# Patient Record
Sex: Female | Born: 1995 | Race: White | Hispanic: No | Marital: Married | Smoking: Never smoker
Health system: Southern US, Community
[De-identification: ages and names within clinical notes are randomized; demographics above are authoritative.]

## PROBLEM LIST (undated history)

## (undated) DIAGNOSIS — Z8619 Personal history of other infectious and parasitic diseases: Secondary | ICD-10-CM

## (undated) DIAGNOSIS — R011 Cardiac murmur, unspecified: Secondary | ICD-10-CM

## (undated) HISTORY — DX: Personal history of other infectious and parasitic diseases: Z86.19

## (undated) HISTORY — PX: WISDOM TOOTH EXTRACTION: SHX21

## (undated) HISTORY — DX: Cardiac murmur, unspecified: R01.1

---

## 2016-06-24 ENCOUNTER — Encounter: Payer: Self-pay | Admitting: Emergency Medicine

## 2016-06-24 ENCOUNTER — Emergency Department
Admission: EM | Admit: 2016-06-24 | Discharge: 2016-06-24 | Discharge status: Against Medical Advice | Payer: Self-pay | Attending: Emergency Medicine | Admitting: Emergency Medicine

## 2016-06-24 DIAGNOSIS — L739 Follicular disorder, unspecified: Secondary | ICD-10-CM | POA: Insufficient documentation

## 2016-06-24 DIAGNOSIS — L539 Erythematous condition, unspecified: Secondary | ICD-10-CM | POA: Insufficient documentation

## 2016-06-24 LAB — BASIC METABOLIC PANEL
ANION GAP: 5 (ref 5–15)
BUN: 15 mg/dL (ref 6–20)
CALCIUM: 9.6 mg/dL (ref 8.9–10.3)
CO2: 27 mmol/L (ref 22–32)
CREATININE: 0.98 mg/dL (ref 0.44–1.00)
Chloride: 106 mmol/L (ref 101–111)
GFR calc non Af Amer: >60 mL/min (ref >60)
Glucose, Bld: 109 mg/dL — ABNORMAL HIGH (ref 65–99)
Potassium: 3.1 mmol/L — ABNORMAL LOW (ref 3.5–5.1)
SODIUM: 138 mmol/L (ref 135–145)

## 2016-06-24 LAB — CBC WITH DIFFERENTIAL/PLATELET
BASOS ABS: 0 10*3/uL (ref 0–0.1)
BASOS PCT: 0 %
EOS ABS: 0.1 10*3/uL (ref 0–0.7)
Eosinophils Relative: 1 %
HEMATOCRIT: 43.6 % (ref 35.0–47.0)
HEMOGLOBIN: 15.4 g/dL (ref 12.0–16.0)
Lymphocytes Relative: 14 %
Lymphs Abs: 1.8 10*3/uL (ref 1.0–3.6)
MCH: 31.1 pg (ref 26.0–34.0)
MCHC: 35.2 g/dL (ref 32.0–36.0)
MCV: 88.3 fL (ref 80.0–100.0)
MONO ABS: 0.1 10*3/uL — AB (ref 0.2–0.9)
MONOS PCT: 1 %
NEUTROS ABS: 10.8 10*3/uL — AB (ref 1.4–6.5)
NEUTROS PCT: 84 %
Platelets: 246 10*3/uL (ref 150–440)
RBC: 4.94 MIL/uL (ref 3.80–5.20)
RDW: 12.6 % (ref 11.5–14.5)
WBC: 12.8 10*3/uL — ABNORMAL HIGH (ref 3.6–11.0)

## 2016-06-24 LAB — URINALYSIS COMPLETE WITH MICROSCOPIC (ARMC ONLY)
BILIRUBIN URINE: NEGATIVE
GLUCOSE, UA: NEGATIVE mg/dL
HGB URINE DIPSTICK: NEGATIVE
KETONES UR: NEGATIVE mg/dL
LEUKOCYTES UA: NEGATIVE
NITRITE: NEGATIVE
PH: 5 (ref 5.0–8.0)
Protein, ur: NEGATIVE mg/dL
SPECIFIC GRAVITY, URINE: 1.01 (ref 1.005–1.030)

## 2016-06-24 LAB — POCT PREGNANCY, URINE: Preg Test, Ur: NEGATIVE

## 2016-06-24 MED ORDER — DIPHENHYDRAMINE HCL 12.5 MG/5ML PO ELIX
25.0000 mg | ORAL_SOLUTION | Freq: Once | ORAL | Status: DC
  Filled 2016-06-24: qty 10

## 2016-06-24 MED ORDER — FAMOTIDINE IN NACL 20-0.9 MG/50ML-% IV SOLN
20.0000 mg | Freq: Once | INTRAVENOUS | Status: AC
  Administered 2016-06-24: 20 mg via INTRAVENOUS
  Filled 2016-06-24: qty 50

## 2016-06-24 MED ORDER — EPINEPHRINE 0.3 MG/0.3ML IJ SOAJ: 0.3000 mg | Freq: Once | INTRAMUSCULAR | 0 refills | Status: AC

## 2016-06-24 MED ORDER — MUPIROCIN CALCIUM 2 % EX CREA: TOPICAL_CREAM | CUTANEOUS | 0 refills | Status: DC

## 2016-06-24 MED ORDER — ACETAMINOPHEN 325 MG PO TABS
650.0000 mg | ORAL_TABLET | Freq: Once | ORAL | Status: AC
Start: 2016-06-24 — End: 2016-06-24
  Administered 2016-06-24: 650 mg via ORAL
  Filled 2016-06-24: qty 2

## 2016-06-24 MED ORDER — METHYLPREDNISOLONE SODIUM SUCC 125 MG IJ SOLR
125.0000 mg | Freq: Once | INTRAMUSCULAR | Status: AC
  Administered 2016-06-24: 125 mg via INTRAVENOUS
  Filled 2016-06-24: qty 2

## 2016-06-24 MED ORDER — PREDNISONE 10 MG PO TABS: ORAL_TABLET | ORAL | 0 refills | Status: DC

## 2016-06-24 MED ORDER — SODIUM CHLORIDE 0.9 % IV BOLUS (SEPSIS)
1000.0000 mL | Freq: Once | INTRAVENOUS | Status: AC
  Administered 2016-06-24: 1000 mL via INTRAVENOUS

## 2016-06-24 MED ORDER — DIPHENHYDRAMINE HCL 50 MG/ML IJ SOLN
25.0000 mg | Freq: Once | INTRAMUSCULAR | Status: AC
  Administered 2016-06-24: 25 mg via INTRAVENOUS

## 2016-06-24 NOTE — ED Triage Notes (Addendum)
Patient ambulatory to triage with steady gait, without difficulty or distress noted; pt reports onset generalized itching after taking sulfa at 8pm (staph infection on leg); pt completed antibiotics in Aug for same; symptoms reoccured week ago; took 2 benadryl at 9pm; pt with several small areas to right leg that appear to be abrasions; st contact with nephew who had ?impetigo; pt st now with chills, skin flushed, c/o pain in hips

## 2016-06-24 NOTE — Discharge Instructions (Signed)
I am concerned about your symptoms today. He had a very significant reaction to that medication. You  must never take anything with sulfa and it again including Bactrim. I have advised you be admitted to the hospital but he would prefer to go home. This is your  choice but does very much limit our ability to watch you as your symptoms possibly progress. Of course, you're welcome and encouraged and advised to return if you feel worse in any way. This means, if you have any swelling to her throat or tongue or lips, any sores or lesions in your mouth, eyes, vagina or any of your other surfaces that we discussed. If you have pain when he urinated, if you have persistent high fevers. If he noticed any of your skin starting to flake or come off. Or if you feel worse in any way. It is vital that you be very vigilant about these symptoms and come back if you need us. We'll send you with a prescription for an EpiPen to use only if you have a life-threatening emergency, that is to say if you feel like he cannot breathe we are otherwise overcome by this reaction. Do not use it unless you must but if you feel that there is a life-threatening reaction use it and call 911. We are giving a prescription for steroids to take. It is a 2 week course of steroids to try to keep this under control. Take it until it is gone. We advised you take 60 mg a day every day for a week and then go down by 10 mg every day for the next week until it is gone. We strongly advise you follow up as an outpatient. For the small areas on your leg I will give you a prescription for mupirocin, apply topically and if those areas get worse return to the emergency room. Please be very vigilant about your health and if you feel worse in any way do not hesitate to come back. As we discussed, sometimes the reaction that you had can progress somewhat rapidly to very significant medical problems involving especially your skin but also your mucosal surfaces and your  body in general.

## 2016-06-24 NOTE — ED Notes (Signed)
Pt. States she started sulfa antibiotic tonight at around 8 pm for a reeaccuate staff infection she had last month.  Pt. States itching started soon after.  Pt. States she took benadryl at about 9 pm.  Pt. Has small lesion on lower rt. Leg and 3 lesions on upper rt. Leg.  Pt. Unsure if this sulfa antibiotic was the same she took last month.

## 2016-06-24 NOTE — ED Notes (Signed)
Pt. States pain in hips has gone away.

## 2016-06-24 NOTE — ED Notes (Signed)
Pt. Up to use bathroom and give urine specimen.

## 2016-06-24 NOTE — ED Provider Notes (Addendum)
[facility] Emergency Department Provider Note  ____________________________________________   I have reviewed the triage vital signs and the nursing notes.   HISTORY  Chief Complaint Allergic Reaction and Fever    HPI Susan Cannon is a 20 y.o. female  who is healthy presents today complaining of a reaction after taking Bactrim. Patient has small impetigo-like lesions on her legs. She states she was recently treated, several weeks ago, with an antibiotic and she cannot recall the name of it. She was given Bactrim today, and took 1 dose. Immediately thereafter, 10 minutes, she began to have diffuse myalgias, red skin, joint pain, injected eyes and felt very poorly. She tried Benadryl and felt a little bit better, that was around 8 PM. However, symptoms have returned and she returns the emergency room. Initially she did have some sensation of tongue swelling that is gone,  upon arrival, she complains of diffuse myalgias and erythematous skin no oral lesions. Denies any shortness of breath or difficulty swallowing or talking      No past medical history on file.  There are no active problems to display for this patient.   History reviewed. No pertinent surgical history.  Prior to Admission medications   Not on File    Allergies Review of patient's allergies indicates no known allergies.  No family history on file.  Social History Social History  Substance Use Topics  . Smoking status: Never Smoker  . Smokeless tobacco: Never Used  . Alcohol use No    Review of Systems Constitutional: She had no fever or chills prior to taking the medication Eyes: No visual changes. ENT: No sore throat. No stiff neck no neck pain Cardiovascular: Denies chest pain. Respiratory: Denies shortness of breath. Gastrointestinal:   no vomiting.  No diarrhea.  No constipation. Genitourinary: Negative for dysuria. Musculoskeletal: Negative lower extremity swelling Skin:  See history of present illness Neurological: Negative for severe headaches, focal weakness or numbness. 10-point ROS otherwise negative.  ____________________________________________   PHYSICAL EXAM:  VITAL SIGNS: ED Triage Vitals  Enc Vitals Group     BP 06/24/16 0040 116/81     Pulse Rate 06/24/16 0040 (!) 130     Resp 06/24/16 0040 18     Temp 06/24/16 0040 99.7 F (37.6 C)     Temp Source 06/24/16 0040 Oral     SpO2 06/24/16 0040 100 %     Weight 06/24/16 0038 130 lb (59 kg)     Height 06/24/16 0038 5\' 4"  (1.626 m)     Head Circumference --      Peak Flow --      Pain Score 06/24/16 0038 10     Pain Loc --      Pain Edu? --      Excl. in GC? --     Constitutional: Alert and oriented. Patient with mild anxiety. Eyes: Conjunctivae are injected. PERRL. EOMI. Head: Atraumatic. Nose: No congestion/rhinnorhea. Mouth/Throat: Mucous membranes are moist.  Oropharynx non-erythematous. Neck: No stridor.   Nontender with no meningismus Cardiovascular: Tachycardia, mild, regular rhythm. Grossly normal heart sounds.  Good peripheral circulation. Respiratory: Normal respiratory effort.  No retractions. Lungs CTAB. Abdominal: Soft and nontender. No distention. No guarding no rebound Back:  There is no focal tenderness or step off.  there is no midline tenderness there are no lesions noted. there is no CVA tenderness Musculoskeletal: No lower extremity tenderness, no upper extremity tenderness. No joint effusions, no DVT signs strong distal pulses no edema Neurologic:  Normal speech and language. No gross focal neurologic deficits are appreciated.  Skin:  Skin is warm, dry and intact. Diffuse blanchable erythema noted. There is one small area about 0.75 cm and round on the lower leg and a few others similarly on the upper leg. They're blanchable, erythematous, they're not raised. They're in various stages of hearing. They're not vesicular. One of them seems to be scabbed over. They are  small little macules. Psychiatric: Mood and affect are normal. Speech and behavior are normal.  ____________________________________________   LABS (all labs ordered are listed, but only abnormal results are displayed)  Labs Reviewed  CBC WITH DIFFERENTIAL/PLATELET - Abnormal; Notable for the following:       Result Value   WBC 12.8 (*)    Neutro Abs 10.8 (*)    Monocytes Absolute 0.1 (*)    All other components within normal limits  BASIC METABOLIC PANEL - Abnormal; Notable for the following:    Potassium 3.1 (*)    Glucose, Bld 109 (*)    All other components within normal limits  URINALYSIS COMPLETEWITH MICROSCOPIC ([facility] ONLY) - Abnormal; Notable for the following:    Color, Urine STRAW (*)    APPearance CLEAR (*)    Bacteria, UA RARE (*)    Squamous Epithelial / LPF 0-5 (*)    All other components within normal limits  POCT PREGNANCY, URINE  POC URINE PREG, ED   ____________________________________________  EKG  I personally interpreted any EKGs ordered by me or triage  ____________________________________________  RADIOLOGY  I reviewed any imaging ordered by me or triage that were performed during my shift and, if possible, patient and/or family made aware of any abnormal findings. ____________________________________________   PROCEDURES  Procedure(s) performed: None  Procedures  Critical Care performed: None  ____________________________________________   INITIAL IMPRESSION / ASSESSMENT AND PLAN / ED COURSE  Pertinent labs & imaging results that were available during my care of the patient were reviewed by me and considered in my medical decision making (see chart for details).  Patient presents with acute onset fever and myalgias erythema and initially some subjective throat tightness. The tightness is completely gone. Patient was given Benadryl Pepcid and steroids here. She had some mild improvement. She does however remain with if use erythematous  scan. The fever also began immediately after the medication. All this is quite consistent with erythroderma or other acute drug reaction. There is no evidence of Stevens-Johnson or desquamation at this time. Patient I think would benefit from admission to the hospital for observation given this acute and serious drug reaction. No active airway issues at this time.  ----------------------------------------- 5:06 AM on 06/24/2016 -----------------------------------------  When the hospitalist evaluated the patient she really declined admission. We have explained him the risks benefits and alternatives to going home including the risk of worsening condition and she states that she feels better and would like to go home. She lives very close to the hospital and states she'll come back if she feels worse. Obviously this is her choice she is certainly capable of understanding the risks benefits and alternatives as explained by myself and the hospitalist. I will continue to observe her here and if she continues to improve we will discharge her at her request. She understands and my recommendation is admission however.  ----------------------------------------- 6:40 AM on 06/24/2016 -----------------------------------------  Patient states she feels better. She is afebrile, heart rate is coming down, she has still mild erythema on her skin, no evidence of oral lesions  no evidence of anaphylactic reaction no evidence of trouble breathing or angioedema. Nonetheless, given the severe drug reaction, I am very reluctant to send her home and I have explained to her the risk of Stevens-Johnson which can be fatal or disfiguring with other morbidities and she understands however she and her boyfriend are much would prefer to go home and they did refuse admission. I feel that she is the capacity to understand the decision-making process here. We will start her on a steroid taper for this and I have given her extensive  return precautions for any signs of Stevens-Johnson including oral vaginal ocular or skin lesions including desquamation. I have also given her information about any other progression of symptoms from this area I remain somewhat concerned about her and I feel that he'll be better for her to be admitted but she refuses and she feels better at this time we will discharge her. I have indicated that she should have a very low threshold for returning. We will use mupirocin for her small bowel localized reactions. I see areas where she has had these before and has left some blanching of the skin. I'm not sure what exact pathology is present. Not classical for infection but I think it topical ointment may be of some use. I have strongly advised that she follow-up with dermatology and give her information about follow-up as well as follow-up with [place] if needed or she changes her mind and follow-up with the total clinic as well. Even though she is elected to leave AGAINST MEDICAL ADVICE, I will certainly of course provide her with the appropriate prescriptions including a steroid taper and EpiPen. We have explained to her exactly when she should use the EpiPen  Clinical Course   ____________________________________________   FINAL CLINICAL IMPRESSION(S) / ED DIAGNOSES  Final diagnoses:  None      This chart was dictated using voice recognition software.  Despite best efforts to proofread,  errors can occur which can change meaning.      Jeanmarie Plant, MD 06/24/16 1308    Jeanmarie Plant, MD 06/24/16 0507    Jeanmarie Plant, MD 06/24/16 [id number]    Jeanmarie Plant, MD 06/24/16 [phone]

## 2017-02-28 ENCOUNTER — Encounter: Payer: Self-pay | Admitting: Internal Medicine

## 2017-02-28 ENCOUNTER — Ambulatory Visit (INDEPENDENT_AMBULATORY_CARE_PROVIDER_SITE_OTHER): Payer: Self-pay | Admitting: Internal Medicine

## 2017-02-28 VITALS — BP 108/68 | HR 78 | Temp 98.1°F | Ht 64.75 in | Wt 129.0 lb

## 2017-02-28 DIAGNOSIS — Z30011 Encounter for initial prescription of contraceptive pills: Secondary | ICD-10-CM

## 2017-02-28 MED ORDER — NORETHINDRONE ACET-ETHINYL EST 1-20 MG-MCG PO TABS: 1.0000 | ORAL_TABLET | Freq: Every day | ORAL | 11 refills | Status: DC

## 2017-02-28 NOTE — Progress Notes (Signed)
HPI  Pt presents to the clinic today to establish care. She has not had a PCP in a few years.  She wants to discuss contraception today. Her periods are regular. Her LMP was 02/24/17. She is not sexually active at this time, but is getting married in May.  Flu: never Tetanus: ? 2010 Dentist: annually  Past Medical History:  Diagnosis Date  . Heart murmur   . History of chicken pox    21 y.o.    No current outpatient prescriptions on file.   No current facility-administered medications for this visit.     Allergies  Allergen Reactions  . Sulfa Antibiotics Anaphylaxis    Family History  Problem Relation Age of Onset  . Colon cancer Paternal Grandfather     Social History   Social History  . Marital status: Single    Spouse name: N/A  . Number of children: N/A  . Years of education: N/A   Occupational History  . Not on file.   Social History Main Topics  . Smoking status: Never Smoker  . Smokeless tobacco: Never Used  . Alcohol use No  . Drug use: No  . Sexual activity: Not on file   Other Topics Concern  . Not on file   Social History Narrative  . No narrative on file    ROS:  Constitutional: Denies fever, malaise, fatigue, headache or abrupt weight changes.  HEENT: Denies eye pain, eye redness, ear pain, ringing in the ears, wax buildup, runny nose, nasal congestion, bloody nose, or sore throat. Respiratory: Denies difficulty breathing, shortness of breath, cough or sputum production.   Cardiovascular: Denies chest pain, chest tightness, palpitations or swelling in the hands or feet.  Gastrointestinal: Denies abdominal pain, bloating, constipation, diarrhea or blood in the stool.  GU: Denies frequency, urgency, pain with urination, blood in urine, odor or discharge. Musculoskeletal: Denies decrease in range of motion, difficulty with gait, muscle pain or joint pain and swelling.  Skin: Denies redness, rashes, lesions or ulcercations.  Neurological:  Denies dizziness, difficulty with memory, difficulty with speech or problems with balance and coordination.  Psych: Denies anxiety, depression, SI/HI.  No other specific complaints in a complete review of systems (except as listed in HPI above).  PE:  BP 108/68   Pulse 78   Temp 98.1 F (36.7 C) (Oral)   Ht 5' 4.75" (1.645 m)   Wt 129 lb (58.5 kg)   LMP 02/24/2017   SpO2 98%   BMI 21.63 kg/m   Wt Readings from Last 3 Encounters:  02/28/17 129 lb (58.5 kg)  06/24/16 130 lb (59 kg)    General: Appears her stated age,  in NAD. Skin: Dry and intact. Cardiovascular: Normal rate and rhythm.  Pulmonary/Chest: Normal effort and positive vesicular breath sounds. No respiratory distress. No wheezes, rales or ronchi noted.  Neurological: Alert and oriented.  Psychiatric: Mood and affect normal. Behavior is normal. Judgment and thought content normal.     BMET    Component Value Date/Time   NA 138 06/24/2016 0052   K 3.1 (L) 06/24/2016 0052   CL 106 06/24/2016 0052   CO2 27 06/24/2016 0052   GLUCOSE 109 (H) 06/24/2016 0052   BUN 15 06/24/2016 0052   CREATININE 0.98 06/24/2016 0052   CALCIUM 9.6 06/24/2016 0052   GFRNONAA >60 06/24/2016 0052   GFRAA >60 06/24/2016 0052    Lipid Panel  No results found for: CHOL, TRIG, HDL, CHOLHDL, VLDL, LDLCALC  CBC  Component Value Date/Time   WBC 12.8 (H) 06/24/2016 0052   RBC 4.94 06/24/2016 0052   HGB 15.4 06/24/2016 0052   HCT 43.6 06/24/2016 0052   PLT 246 06/24/2016 0052   MCV 88.3 06/24/2016 0052   MCH 31.1 06/24/2016 0052   MCHC 35.2 06/24/2016 0052   RDW 12.6 06/24/2016 0052   LYMPHSABS 1.8 06/24/2016 0052   MONOABS 0.1 (L) 06/24/2016 0052   EOSABS 0.1 06/24/2016 0052   BASOSABS 0.0 06/24/2016 0052    Hgb A1C No results found for: HGBA1C   Assessment and Plan:  Encounter for Initiation of OCPs:  Discussed risks and benefits of the pill Discussed using a backup method the first 2 weeks and when on abx eRx  for Junel sent to pharmacy Discussed cervical cancer screening starting at age 821  Make an appt for your annual exam Nicki ReaperBAITY, REGINA, NP

## 2017-02-28 NOTE — Patient Instructions (Signed)

## 2018-01-18 ENCOUNTER — Telehealth: Payer: Self-pay

## 2018-01-18 MED ORDER — NORETHINDRONE ACET-ETHINYL EST 1-20 MG-MCG PO TABS: 1.0000 | ORAL_TABLET | Freq: Every day | ORAL | 1 refills | Status: DC

## 2018-01-18 NOTE — Telephone Encounter (Signed)
Copied from CRM [id number]. Topic: Appointment Scheduling - Scheduling Inquiry for Clinic >> Jan 18, 2018  8:34 AM Eston Mouldavis, Cheri B wrote: Reason for CRM: PT has cpe scheduled in May but has no more refills on birth control,  she wants to know if she needs to make an apt before the cpe apt to be able to continue getting birth control

## 2018-01-18 NOTE — Telephone Encounter (Signed)
I spoke with [facility] and the rx was transferred to their pharmacy and pt has no more refills. Verbally gave OK for 2 refills until CPX 02/21/18. Pt notified and voiced understanding.

## 2018-02-14 ENCOUNTER — Other Ambulatory Visit: Payer: Self-pay

## 2018-02-21 ENCOUNTER — Other Ambulatory Visit (HOSPITAL_COMMUNITY)
Admission: RE | Admit: 2018-02-21 | Discharge: 2018-02-21 | Disposition: A | Discharge status: Home / Self Care | Payer: Managed Care, Other (non HMO) | Source: Ambulatory Visit | Attending: Internal Medicine | Admitting: Internal Medicine

## 2018-02-21 ENCOUNTER — Ambulatory Visit (INDEPENDENT_AMBULATORY_CARE_PROVIDER_SITE_OTHER): Payer: Managed Care, Other (non HMO) | Admitting: Internal Medicine

## 2018-02-21 ENCOUNTER — Encounter: Payer: Self-pay | Admitting: Internal Medicine

## 2018-02-21 VITALS — BP 106/70 | HR 73 | Temp 98.4°F | Ht 64.5 in | Wt 138.0 lb

## 2018-02-21 DIAGNOSIS — Z23 Encounter for immunization: Secondary | ICD-10-CM

## 2018-02-21 DIAGNOSIS — Z0000 Encounter for general adult medical examination without abnormal findings: Secondary | ICD-10-CM

## 2018-02-21 DIAGNOSIS — Z113 Encounter for screening for infections with a predominantly sexual mode of transmission: Secondary | ICD-10-CM

## 2018-02-21 DIAGNOSIS — N926 Irregular menstruation, unspecified: Secondary | ICD-10-CM | POA: Insufficient documentation

## 2018-02-21 DIAGNOSIS — Z124 Encounter for screening for malignant neoplasm of cervix: Secondary | ICD-10-CM

## 2018-02-21 LAB — CBC
HEMATOCRIT: 40.9 % (ref 36.0–46.0)
HEMOGLOBIN: 13.9 g/dL (ref 12.0–15.0)
MCHC: 34 g/dL (ref 30.0–36.0)
MCV: 91.9 fl (ref 78.0–100.0)
PLATELETS: 312 10*3/uL (ref 150.0–400.0)
RBC: 4.45 Mil/uL (ref 3.87–5.11)
RDW: 12.5 % (ref 11.5–15.5)
WBC: 11.2 10*3/uL — AB (ref 4.0–10.5)

## 2018-02-21 LAB — LIPID PANEL
CHOLESTEROL: 153 mg/dL (ref 0–200)
HDL: 47.3 mg/dL (ref >39.00)
LDL Cholesterol: 78 mg/dL (ref 0–99)
NonHDL: 105.33
Total CHOL/HDL Ratio: 3
Triglycerides: 136 mg/dL (ref 0.0–149.0)
VLDL: 27.2 mg/dL (ref 0.0–40.0)

## 2018-02-21 LAB — COMPREHENSIVE METABOLIC PANEL
ALBUMIN: 4.2 g/dL (ref 3.5–5.2)
ALK PHOS: 42 U/L (ref 39–117)
ALT: 13 U/L (ref 0–35)
AST: 16 U/L (ref 0–37)
BUN: 10 mg/dL (ref 6–23)
CO2: 28 mEq/L (ref 19–32)
Calcium: 9.6 mg/dL (ref 8.4–10.5)
Chloride: 102 mEq/L (ref 96–112)
Creatinine, Ser: 0.78 mg/dL (ref 0.40–1.20)
GFR: 98.43 mL/min (ref >60.00)
Glucose, Bld: 87 mg/dL (ref 70–99)
POTASSIUM: 4.1 meq/L (ref 3.5–5.1)
Sodium: 137 mEq/L (ref 135–145)
TOTAL PROTEIN: 7.1 g/dL (ref 6.0–8.3)
Total Bilirubin: 0.5 mg/dL (ref 0.2–1.2)

## 2018-02-21 LAB — POCT URINE PREGNANCY: Preg Test, Ur: NEGATIVE

## 2018-02-21 NOTE — Progress Notes (Signed)
Subjective:    Patient ID: Susan Cannon, female    DOB: [birth year], 22 y.o.   MRN: [id number]  HPI  Pt presents to the clinic today for her annual exam.  Flu: never Tetanus: ? 2010 Pap Smear: never Dentist: annually  Diet: She does eat meat. She consumes fruits and veggies daily. She does eat some fried foods. She drinks mostly water. Exercise: [employer] 6 days per week  Review of Systems      Past Medical History:  Diagnosis Date  . Heart murmur   . History of chicken pox    22 y.o.    Current Outpatient Medications  Medication Sig Dispense Refill  . norethindrone-ethinyl estradiol (MICROGESTIN,JUNEL,LOESTRIN) 1-20 MG-MCG tablet Take 1 tablet by mouth daily. 1 Package 1   No current facility-administered medications for this visit.     Allergies  Allergen Reactions  . Sulfa Antibiotics Anaphylaxis    Family History  Problem Relation Age of Onset  . Colon cancer Paternal Grandfather     Social History   Socioeconomic History  . Marital status: Single    Spouse name: Not on file  . Number of children: Not on file  . Years of education: Not on file  . Highest education level: Not on file  Occupational History  . Not on file  Social Needs  . Financial resource strain: Not on file  . Food insecurity:    Worry: Not on file    Inability: Not on file  . Transportation needs:    Medical: Not on file    Non-medical: Not on file  Tobacco Use  . Smoking status: Never Smoker  . Smokeless tobacco: Never Used  Substance and Sexual Activity  . Alcohol use: No  . Drug use: No  . Sexual activity: Yes  Lifestyle  . Physical activity:    Days per week: Not on file    Minutes per session: Not on file  . Stress: Not on file  Relationships  . Social connections:    Talks on phone: Not on file    Gets together: Not on file    Attends religious service: Not on file    Active member of club or organization: Not on file    Attends meetings of clubs or  organizations: Not on file    Relationship status: Not on file  . Intimate partner violence:    Fear of current or ex partner: Not on file    Emotionally abused: Not on file    Physically abused: Not on file    Forced sexual activity: Not on file  Other Topics Concern  . Not on file  Social History Narrative  . Not on file     Constitutional: Denies fever, malaise, fatigue, headache or abrupt weight changes.  HEENT: Denies eye pain, eye redness, ear pain, ringing in the ears, wax buildup, runny nose, nasal congestion, bloody nose, or sore throat. Respiratory: Denies difficulty breathing, shortness of breath, cough or sputum production.   Cardiovascular: Denies chest pain, chest tightness, palpitations or swelling in the hands or feet.  Gastrointestinal: Denies abdominal pain, bloating, constipation, diarrhea or blood in the stool.  GU: Pt reports irregular periods. Denies urgency, frequency, pain with urination, burning sensation, blood in urine, odor or discharge. Musculoskeletal: Denies decrease in range of motion, difficulty with gait, muscle pain or joint pain and swelling.  Skin: Denies redness, rashes, lesions or ulcercations.  Neurological: Denies dizziness, difficulty with memory, difficulty with speech or problems with balance  and coordination.  Psych: Denies anxiety, depression, SI/HI.  No other specific complaints in a complete review of systems (except as listed in HPI above).  Objective:   Physical Exam   BP 106/70   Pulse 73   Temp 98.4 F (36.9 C) (Oral)   Ht 5' 4.5" (1.638 m)   Wt 138 lb (62.6 kg)   LMP 11/26/2017 Comment: irregular  SpO2 98%   BMI 23.32 kg/m  Wt Readings from Last 3 Encounters:  02/21/18 138 lb (62.6 kg)  02/28/17 129 lb (58.5 kg)  06/24/16 130 lb (59 kg)    General: Appears her stated age, well developed, well nourished in NAD. Skin: Warm, dry and intact. No rashes, lesions or ulcerations noted. HEENT: Head: normal shape and size;  Eyes: sclera white, no icterus, conjunctiva pink, PERRLA and EOMs intact; Ears: Tm's gray and intact, normal light reflex; Throat/Mouth: Teeth present, mucosa pink and moist, no exudate, lesions or ulcerations noted.  Neck:  Neck supple, trachea midline. No masses, lumps or thyromegaly present.  Cardiovascular: Normal rate and rhythm. S1,S2 noted.  No murmur, rubs or gallops noted. No JVD or BLE edema.  Pulmonary/Chest: Normal effort and positive vesicular breath sounds. No respiratory distress. No wheezes, rales or ronchi noted.  Abdomen: Soft and nontender. Normal bowel sounds. No distention or masses noted. Liver, spleen and kidneys non palpable. Pelvic: Normal female anatomy. Cervix without changes. Small amount of thick, white discharge noted in vaginal vault. No CMT. Adnexa non palpable. Musculoskeletal: Strength 5/5 BUE/BLE. No difficulty with gait.  Neurological: Alert and oriented. Cranial nerves II-XII grossly intact. Coordination normal.  Psychiatric: Mood and affect normal. Behavior is normal. Judgment and thought content normal.     BMET    Component Value Date/Time   NA 138 06/24/2016 0052   K 3.1 (L) 06/24/2016 0052   CL 106 06/24/2016 0052   CO2 27 06/24/2016 0052   GLUCOSE 109 (H) 06/24/2016 0052   BUN 15 06/24/2016 0052   CREATININE 0.98 06/24/2016 0052   CALCIUM 9.6 06/24/2016 0052   GFRNONAA >60 06/24/2016 0052   GFRAA >60 06/24/2016 0052    Lipid Panel  No results found for: CHOL, TRIG, HDL, CHOLHDL, VLDL, LDLCALC  CBC    Component Value Date/Time   WBC 12.8 (H) 06/24/2016 0052   RBC 4.94 06/24/2016 0052   HGB 15.4 06/24/2016 0052   HCT 43.6 06/24/2016 0052   PLT 246 06/24/2016 0052   MCV 88.3 06/24/2016 0052   MCH 31.1 06/24/2016 0052   MCHC 35.2 06/24/2016 0052   RDW 12.6 06/24/2016 0052   LYMPHSABS 1.8 06/24/2016 0052   MONOABS 0.1 (L) 06/24/2016 0052   EOSABS 0.1 06/24/2016 0052   BASOSABS 0.0 06/24/2016 0052    Hgb A1C No results found for:  HGBA1C         Assessment & Plan:   Preventative Health Maintenance:  Encouraged her to get a flu shot today Tdap today Pap smear today including STD screening, she declines HIV or RPR Encouraged her to consume a balanced diet and exercise regimen Advised her to see an eye doctor and dentist annually Will check CBC, CMET, Lipid profile today  Irregular Periods:  Urine HcG today negative Continue OCP's  RTC in 1 year, sooner if needed Nicki Reaper, NP

## 2018-02-21 NOTE — Addendum Note (Signed)
Addended by: Roena Malady on: 02/21/2018 04:05 PM   Modules accepted: Orders

## 2018-02-21 NOTE — Patient Instructions (Signed)

## 2018-02-22 NOTE — Addendum Note (Signed)
Addended by: Roena Malady on: 02/22/2018 09:29 AM   Modules accepted: Orders

## 2018-02-24 ENCOUNTER — Other Ambulatory Visit: Payer: Self-pay | Admitting: Internal Medicine

## 2018-02-24 LAB — CYTOLOGY - PAP
Bacterial vaginitis: NEGATIVE
Candida vaginitis: POSITIVE — AB
Chlamydia: NEGATIVE
DIAGNOSIS: NEGATIVE
NEISSERIA GONORRHEA: NEGATIVE
TRICH (WINDOWPATH): NEGATIVE

## 2018-02-24 MED ORDER — FLUCONAZOLE 150 MG PO TABS
150.0000 mg | ORAL_TABLET | Freq: Once | ORAL | 0 refills | Status: AC
Start: 2018-02-24 — End: 2018-02-24

## 2018-02-24 NOTE — Progress Notes (Signed)
Diflucan

## 2018-03-18 ENCOUNTER — Other Ambulatory Visit: Payer: Self-pay | Admitting: Internal Medicine

## 2018-03-20 ENCOUNTER — Other Ambulatory Visit: Payer: Self-pay | Admitting: Internal Medicine

## 2018-11-30 DIAGNOSIS — F411 Generalized anxiety disorder: Secondary | ICD-10-CM | POA: Insufficient documentation

## 2019-08-13 ENCOUNTER — Other Ambulatory Visit: Payer: Self-pay

## 2019-08-13 DIAGNOSIS — Z20822 Contact with and (suspected) exposure to covid-19: Secondary | ICD-10-CM

## 2019-08-14 LAB — NOVEL CORONAVIRUS, NAA: SARS-CoV-2, NAA: NOT DETECTED

## 2020-02-20 DIAGNOSIS — Z1389 Encounter for screening for other disorder: Secondary | ICD-10-CM | POA: Diagnosis not present

## 2020-02-20 DIAGNOSIS — Z0000 Encounter for general adult medical examination without abnormal findings: Secondary | ICD-10-CM | POA: Diagnosis not present

## 2020-02-20 DIAGNOSIS — Z13220 Encounter for screening for lipoid disorders: Secondary | ICD-10-CM | POA: Diagnosis not present

## 2020-02-20 DIAGNOSIS — Z1159 Encounter for screening for other viral diseases: Secondary | ICD-10-CM | POA: Diagnosis not present

## 2020-02-22 DIAGNOSIS — R809 Proteinuria, unspecified: Secondary | ICD-10-CM | POA: Diagnosis not present

## 2020-02-22 DIAGNOSIS — Z23 Encounter for immunization: Secondary | ICD-10-CM | POA: Diagnosis not present

## 2020-03-12 DIAGNOSIS — N912 Amenorrhea, unspecified: Secondary | ICD-10-CM | POA: Diagnosis not present

## 2020-04-09 DIAGNOSIS — Z1329 Encounter for screening for other suspected endocrine disorder: Secondary | ICD-10-CM | POA: Diagnosis not present

## 2020-04-09 DIAGNOSIS — Z114 Encounter for screening for human immunodeficiency virus [HIV]: Secondary | ICD-10-CM | POA: Diagnosis not present

## 2020-04-09 DIAGNOSIS — Z131 Encounter for screening for diabetes mellitus: Secondary | ICD-10-CM | POA: Diagnosis not present

## 2020-04-09 DIAGNOSIS — Z3491 Encounter for supervision of normal pregnancy, unspecified, first trimester: Secondary | ICD-10-CM | POA: Diagnosis not present

## 2020-04-09 DIAGNOSIS — Z113 Encounter for screening for infections with a predominantly sexual mode of transmission: Secondary | ICD-10-CM | POA: Diagnosis not present

## 2020-04-17 DIAGNOSIS — O3680X0 Pregnancy with inconclusive fetal viability, not applicable or unspecified: Secondary | ICD-10-CM | POA: Diagnosis not present

## 2020-04-17 DIAGNOSIS — O021 Missed abortion: Secondary | ICD-10-CM | POA: Diagnosis not present

## 2020-04-17 NOTE — H&P (Signed)
Chief Complaint:      Chief Complaint  Patient presents with  . Miscarriage    HPI:  Susan Cannon is a 24 y.o. G32P0000 female who presents for an established patient office visit for follow up after ultrasound to assess fetal viability. Heart tones were unable to be heard with doppler at appointment earlier today and tablet ultrasound was concerning for non-viable preganncy.   Patient's last menstrual period was 01/22/2020.      Patient Active Problem List  Diagnosis  . GAD (generalized anxiety disorder)  . Procreation management investigation and testing  . Amenorrhea  . Supervision of low-risk pregnancy, first trimester  . Pregnancy with uncertain fetal viability, single or unspecified fetus     Past Medical History:  has a past medical history of Anxiety and Heart murmur, unspecified.  Past Surgical History:  has a past surgical history that includes other (2017). Family History: family history includes High blood pressure (Hypertension) in her father; Hyperlipidemia (Elevated cholesterol) in her father; No Known Problems in her mother. Social History:  reports that she has never smoked. She has never used smokeless tobacco. She reports that she does not drink alcohol and does not use drugs. OB/GYN History:          OB History    Gravida  1   Para  0   Term  0   Preterm  0   AB  0   Living  0     SAB  0   TAB  0   Ectopic  0   Molar  0   Multiple  0   Live Births  0          Allergies: is allergic to sulfa (sulfonamide antibiotics). Medications:  Current Outpatient Medications:  .  prenatal vitamin-iron-FA (PRENATE PLUS) tablet, Take 1 tablet by mouth once daily, Disp: , Rfl:   Review of Systems: Pertinent positives and negatives per HPI, otherwise a 3 system ROS was negative.   Objective:    Exam:   Constitutional:    Vitals:   04/17/20 1511  BP: 120/76   Body mass index is 25.29 kg/m.   General Appearance:     Well-developed, well-nourished, tearful, appears stated age  Back:     Symmetric, no curvature, no CVA tenderness  Lungs:     Respirations unlabored  Neuro: Psych:   Alert, oriented x3  Appropriate mood and insight, judgement intact   TVUS 03/12/2020: -SIUP Seen with yolk sac and fetal pole -CRL=0.88cm=[redacted]w[redacted]d -FHR=145bpm -Cervical length=4.36cm  TVUS today: -SIUP seen with non-viable fetal pole -CRL=1.75cm=[redacted]w[redacted]d, not CWD -No fetal heart motion seen -b/l ovaries WNL   Labs: Blood type: O+   Impression:   The encounter diagnosis was Missed abortion.   Plan:   Spontaneous miscarriage:  Causes of spontaneous miscarriage discussed with patient, including prevalence, common causes, and the expectation that this event does not increase the chance that she will miscarry again in the future. Emotional support given.  Management options discussed, including expectant, medical and surgical. Susan Cannon has opted for surgical managment.   Surgical management:  Consents signed today. Risks of surgery were discussed with the patient including but not limited to: bleeding which may require transfusion; infection which may require antibiotics; injury to uterus or surrounding organs; intrauterine scarring which may impair future fertility; need for additional procedures including laparotomy or laparoscopy; and other postoperative/anesthesia complications. Written informed consent was obtained.  This is a scheduled for 4 days from now. She will  have a postop visit in 2 weeks to review operative findings and pathology.  She may want Anora- ask in preop. Husband also will want to be in preop with her

## 2020-04-18 ENCOUNTER — Other Ambulatory Visit: Payer: Self-pay | Admitting: Obstetrics and Gynecology

## 2020-04-18 ENCOUNTER — Other Ambulatory Visit
Admission: RE | Admit: 2020-04-18 | Discharge: 2020-04-18 | Disposition: A | Discharge status: Home / Self Care | Payer: 59 | Source: Ambulatory Visit | Attending: Obstetrics and Gynecology | Admitting: Obstetrics and Gynecology

## 2020-04-18 ENCOUNTER — Other Ambulatory Visit: Payer: Self-pay

## 2020-04-18 DIAGNOSIS — Z20822 Contact with and (suspected) exposure to covid-19: Secondary | ICD-10-CM | POA: Diagnosis not present

## 2020-04-18 DIAGNOSIS — Z01812 Encounter for preprocedural laboratory examination: Secondary | ICD-10-CM | POA: Insufficient documentation

## 2020-04-19 LAB — SARS CORONAVIRUS 2 (TAT 6-24 HRS): SARS Coronavirus 2: NEGATIVE

## 2020-04-21 ENCOUNTER — Ambulatory Visit: Payer: 59 | Admitting: Anesthesiology

## 2020-04-21 ENCOUNTER — Other Ambulatory Visit: Payer: Self-pay

## 2020-04-21 ENCOUNTER — Ambulatory Visit
Admission: RE | Admit: 2020-04-21 | Discharge: 2020-04-21 | Disposition: A | Discharge status: Home / Self Care | Payer: 59 | Attending: Obstetrics and Gynecology | Admitting: Obstetrics and Gynecology

## 2020-04-21 ENCOUNTER — Encounter: Payer: Self-pay | Admitting: Obstetrics and Gynecology

## 2020-04-21 ENCOUNTER — Encounter
Admission: RE | Disposition: A | Discharge status: Home / Self Care | Payer: Self-pay | Source: Home / Self Care | Attending: Obstetrics and Gynecology

## 2020-04-21 DIAGNOSIS — O021 Missed abortion: Secondary | ICD-10-CM | POA: Diagnosis not present

## 2020-04-21 DIAGNOSIS — Z882 Allergy status to sulfonamides status: Secondary | ICD-10-CM | POA: Insufficient documentation

## 2020-04-21 DIAGNOSIS — O039 Complete or unspecified spontaneous abortion without complication: Secondary | ICD-10-CM | POA: Diagnosis present

## 2020-04-21 HISTORY — PX: DILATION AND EVACUATION: SHX1459

## 2020-04-21 LAB — ABO/RH: ABO/RH(D): O POS

## 2020-04-21 LAB — CBC
HCT: 37.9 % (ref 36.0–46.0)
Hemoglobin: 13.4 g/dL (ref 12.0–15.0)
MCH: 31.6 pg (ref 26.0–34.0)
MCHC: 35.4 g/dL (ref 30.0–36.0)
MCV: 89.4 fL (ref 80.0–100.0)
Platelets: 254 10*3/uL (ref 150–400)
RBC: 4.24 MIL/uL (ref 3.87–5.11)
RDW: 12.1 % (ref 11.5–15.5)
WBC: 9.7 10*3/uL (ref 4.0–10.5)
nRBC: 0 % (ref 0.0–0.2)

## 2020-04-21 LAB — TYPE AND SCREEN
ABO/RH(D): O POS
Antibody Screen: NEGATIVE

## 2020-04-21 LAB — BASIC METABOLIC PANEL
Anion gap: 9 (ref 5–15)
BUN: 8 mg/dL (ref 6–20)
CO2: 23 mmol/L (ref 22–32)
Calcium: 9 mg/dL (ref 8.9–10.3)
Chloride: 105 mmol/L (ref 98–111)
Creatinine, Ser: 0.69 mg/dL (ref 0.44–1.00)
GFR calc Af Amer: >60 mL/min (ref >60)
GFR calc non Af Amer: >60 mL/min (ref >60)
Glucose, Bld: 100 mg/dL — ABNORMAL HIGH (ref 70–99)
Potassium: 3.3 mmol/L — ABNORMAL LOW (ref 3.5–5.1)
Sodium: 137 mmol/L (ref 135–145)

## 2020-04-21 SURGERY — DILATION AND EVACUATION, UTERUS
Anesthesia: General | Site: Vagina

## 2020-04-21 MED ORDER — CHLORHEXIDINE GLUCONATE 0.12 % MT SOLN
OROMUCOSAL | Status: AC
  Administered 2020-04-21: 15 mL via OROMUCOSAL
  Filled 2020-04-21: qty 15

## 2020-04-21 MED ORDER — LIDOCAINE HCL (CARDIAC) PF 100 MG/5ML IV SOSY
PREFILLED_SYRINGE | INTRAVENOUS | Status: DC | PRN
  Administered 2020-04-21: 100 mg via INTRAVENOUS

## 2020-04-21 MED ORDER — IBUPROFEN 800 MG PO TABS
800.0000 mg | ORAL_TABLET | Freq: Three times a day (TID) | ORAL | 1 refills | Status: DC | PRN
Start: 2020-04-21 — End: 2021-02-13

## 2020-04-21 MED ORDER — LIDOCAINE HCL (PF) 2 % IJ SOLN
INTRAMUSCULAR | Status: AC
  Filled 2020-04-21: qty 5

## 2020-04-21 MED ORDER — GLYCOPYRROLATE 0.2 MG/ML IJ SOLN
INTRAMUSCULAR | Status: AC
  Filled 2020-04-21: qty 1

## 2020-04-21 MED ORDER — POVIDONE-IODINE 10 % EX SWAB: 2.0000 "application " | Freq: Once | CUTANEOUS | Status: DC

## 2020-04-21 MED ORDER — ONDANSETRON HCL 4 MG/2ML IJ SOLN
INTRAMUSCULAR | Status: AC
  Filled 2020-04-21: qty 2

## 2020-04-21 MED ORDER — ONDANSETRON HCL 4 MG/2ML IJ SOLN
INTRAMUSCULAR | Status: DC | PRN
  Administered 2020-04-21: 4 mg via INTRAVENOUS

## 2020-04-21 MED ORDER — ORAL CARE MOUTH RINSE: 15.0000 mL | Freq: Once | OROMUCOSAL | Status: AC

## 2020-04-21 MED ORDER — MIDAZOLAM HCL 2 MG/2ML IJ SOLN
INTRAMUSCULAR | Status: DC | PRN
  Administered 2020-04-21: 2 mg via INTRAVENOUS

## 2020-04-21 MED ORDER — FENTANYL CITRATE (PF) 100 MCG/2ML IJ SOLN: 25.0000 ug | INTRAMUSCULAR | Status: DC | PRN

## 2020-04-21 MED ORDER — DEXAMETHASONE SODIUM PHOSPHATE 10 MG/ML IJ SOLN
INTRAMUSCULAR | Status: DC | PRN
  Administered 2020-04-21: 10 mg via INTRAVENOUS

## 2020-04-21 MED ORDER — GLYCOPYRROLATE 0.2 MG/ML IJ SOLN
INTRAMUSCULAR | Status: DC | PRN
  Administered 2020-04-21: .2 mg via INTRAVENOUS

## 2020-04-21 MED ORDER — MIDAZOLAM HCL 2 MG/2ML IJ SOLN
INTRAMUSCULAR | Status: AC
  Filled 2020-04-21: qty 2

## 2020-04-21 MED ORDER — DEXAMETHASONE SODIUM PHOSPHATE 10 MG/ML IJ SOLN
INTRAMUSCULAR | Status: AC
  Filled 2020-04-21: qty 1

## 2020-04-21 MED ORDER — OXYCODONE HCL 5 MG/5ML PO SOLN: 5.0000 mg | Freq: Once | ORAL | Status: DC | PRN

## 2020-04-21 MED ORDER — PROPOFOL 10 MG/ML IV BOLUS
INTRAVENOUS | Status: AC
  Filled 2020-04-21: qty 20

## 2020-04-21 MED ORDER — LACTATED RINGERS IV SOLN: INTRAVENOUS | Status: DC

## 2020-04-21 MED ORDER — FENTANYL CITRATE (PF) 100 MCG/2ML IJ SOLN
INTRAMUSCULAR | Status: DC | PRN
  Administered 2020-04-21 (×2): 50 ug via INTRAVENOUS

## 2020-04-21 MED ORDER — SUCCINYLCHOLINE CHLORIDE 200 MG/10ML IV SOSY
PREFILLED_SYRINGE | INTRAVENOUS | Status: AC
  Filled 2020-04-21: qty 10

## 2020-04-21 MED ORDER — OXYCODONE HCL 5 MG PO TABS: 5.0000 mg | ORAL_TABLET | Freq: Once | ORAL | Status: DC | PRN

## 2020-04-21 MED ORDER — CHLORHEXIDINE GLUCONATE 0.12 % MT SOLN: 15.0000 mL | Freq: Once | OROMUCOSAL | Status: AC

## 2020-04-21 MED ORDER — PROPOFOL 10 MG/ML IV BOLUS
INTRAVENOUS | Status: DC | PRN
  Administered 2020-04-21 (×2): 50 mg via INTRAVENOUS
  Administered 2020-04-21: 150 mg via INTRAVENOUS

## 2020-04-21 MED ORDER — FENTANYL CITRATE (PF) 100 MCG/2ML IJ SOLN
INTRAMUSCULAR | Status: AC
  Filled 2020-04-21: qty 2

## 2020-04-21 SURGICAL SUPPLY — 21 items
CATH ROBINSON RED A/P 16FR (CATHETERS) ×3 IMPLANT
COVER WAND RF STERILE (DRAPES) ×3 IMPLANT
FILTER UTR ASPR SPEC (MISCELLANEOUS) ×1 IMPLANT
FLTR UTR ASPR SPEC (MISCELLANEOUS) ×3
GLOVE BIO SURGEON STRL SZ7 (GLOVE) ×3 IMPLANT
GLOVE INDICATOR 7.5 STRL GRN (GLOVE) ×3 IMPLANT
GOWN STRL REUS W/ TWL LRG LVL3 (GOWN DISPOSABLE) ×2 IMPLANT
GOWN STRL REUS W/TWL LRG LVL3 (GOWN DISPOSABLE) ×6
KIT BERKELEY 1ST TRIMESTER 3/8 (MISCELLANEOUS) ×3 IMPLANT
KIT TURNOVER CYSTO (KITS) ×3 IMPLANT
PACK DNC HYST (MISCELLANEOUS) ×3 IMPLANT
PAD OB MATERNITY 4.3X12.25 (PERSONAL CARE ITEMS) ×3 IMPLANT
PAD PREP 24X41 OB/GYN DISP (PERSONAL CARE ITEMS) ×3 IMPLANT
SET BERKELEY SUCTION TUBING (SUCTIONS) ×3 IMPLANT
TOWEL OR 17X26 4PK STRL BLUE (TOWEL DISPOSABLE) ×3 IMPLANT
VACURETTE 10 RIGID CVD (CANNULA) IMPLANT
VACURETTE 6 ASPIR F TIP BERK (CANNULA) ×3 IMPLANT
VACURETTE 7MM F TIP (CANNULA)
VACURETTE 7MM F TIP STRL (CANNULA) IMPLANT
VACURETTE 8 RIGID CVD (CANNULA) IMPLANT
VACURETTE 8MM F TIP (MISCELLANEOUS) ×3 IMPLANT

## 2020-04-21 NOTE — Anesthesia Preprocedure Evaluation (Signed)
Anesthesia Evaluation  Patient identified by MRN, date of birth, ID band Patient awake    Reviewed: Allergy & Precautions, H&P , NPO status , Patient's Chart, lab work & pertinent test results  History of Anesthesia Complications Negative for: history of anesthetic complications  Airway Mallampati: II  TM Distance: >3 FB Neck ROM: full    Dental  (+) Chipped   Pulmonary neg pulmonary ROS, neg shortness of breath,    Pulmonary exam normal        Cardiovascular Exercise Tolerance: Good (-) angina(-) Past MI and (-) DOE Normal cardiovascular exam     Neuro/Psych negative neurological ROS  negative psych ROS   GI/Hepatic negative GI ROS, Neg liver ROS,   Endo/Other  negative endocrine ROS  Renal/GU      Musculoskeletal   Abdominal   Peds  Hematology negative hematology ROS (+)   Anesthesia Other Findings Past Medical History: No date: Heart murmur No date: History of chicken pox     Comment:  24 y.o.  Past Surgical History: No date: WISDOM TOOTH EXTRACTION  BMI    Body Mass Index: 24.96 kg/m      Reproductive/Obstetrics negative OB ROS                             Anesthesia Physical Anesthesia Plan  ASA: I  Anesthesia Plan: General   Post-op Pain Management:    Induction: Intravenous  PONV Risk Score and Plan: Dexamethasone, Ondansetron, Midazolam and Treatment may vary due to age or medical condition  Airway Management Planned: Natural Airway and Nasal Cannula  Additional Equipment:   Intra-op Plan:   Post-operative Plan:   Informed Consent: I have reviewed the patients History and Physical, chart, labs and discussed the procedure including the risks, benefits and alternatives for the proposed anesthesia with the patient or authorized representative who has indicated his/her understanding and acceptance.     Dental Advisory Given  Plan Discussed with:  Anesthesiologist, CRNA and Surgeon  Anesthesia Plan Comments: (Patient consented for risks of anesthesia including but not limited to:  - adverse reactions to medications - risk of intubation if required - damage to eyes, teeth, lips or other oral mucosa - nerve damage due to positioning  - sore throat or hoarseness - Damage to heart, brain, nerves, lungs, other parts of body or loss of life  Patient voiced understanding.)        Anesthesia Quick Evaluation

## 2020-04-21 NOTE — Transfer of Care (Signed)
Immediate Anesthesia Transfer of Care Note  Patient: Susan Cannon  Procedure(s) Performed: Procedure(s): DILATATION AND EVACUATION (SUCTION D&C) (N/A)  Patient Location: PACU  Anesthesia Type:General  Level of Consciousness: sedated  Airway & Oxygen Therapy: Patient Spontanous Breathing and Patient connected to face mask oxygen  Post-op Assessment: Report given to RN and Post -op Vital signs reviewed and stable  Post vital signs: Reviewed and stable  Last Vitals:  Vitals:   04/21/20 1300 04/21/20 1551  BP: (!) 139/102 (!) 85/50  Pulse: (!) 106 65  Resp: 16 14  Temp: 36.8 C (!) 36.2 C  SpO2: 99% 99%    Complications: No apparent anesthesia complications

## 2020-04-21 NOTE — Discharge Instructions (Signed)
Discharge instructions after a Dilation and Curettage  Signs and Symptoms to Report  Call our office at [phone] if you have any of the following:   . Fever over 100.4 degrees or higher . Severe stomach pain not relieved with pain medications . Bright red bleeding that's heavier than a period that does not slow with rest after the first 24 hours . To go the bathroom a lot (frequency), you can't hold your urine (urgency), or it hurts when you empty your bladder (urinate) . Chest pain . Shortness of breath . Pain in the calves of your legs . Severe nausea and vomiting not relieved with anti-nausea medications . Any concerns  What You Can Expect after Surgery . You may see some pink tinged, bloody fluid. This is normal. You may also have cramping for several days.   Activities after Your Discharge Follow these guidelines to help speed your recovery at home: . Don't drive if you are in pain or taking narcotic pain medicine. You may drive when you can safely slam on the brakes, turn the wheel forcefully, and rotate your torso comfortably. This is typically 4-7 days. Practice in a parking lot or side street prior to attempting to drive regularly.  . Ask others to help with household chores until you feel up to doing tasks. . Don't do strenuous activities, exercises, or sports like vacuuming, tennis, squash, etc. until your doctor says it is safe to do so. . Walk as you feel able. Rest often since it may take a week or two for your energy level to return to normal.  . You may climb stairs . Avoid constipation:   -Eat fruits, vegetables, and whole grains. Eat small meals as your appetite will take time to return to normal.   -Drink 6 to 8 glasses of water each day unless your doctor has told you to limit your fluids.   -Use a laxative or stool softener as needed if constipation becomes a problem. You may take Miralax, metamucil, Citrucil, Colace, Senekot, FiberCon, etc. If this does not  relieve the constipation, try two tablespoons of Milk Of Magnesia every 8 hours until your bowels move.  . You may shower.  . Do not get in a hot tub, swimming pool, etc. until your doctor agrees. . Do not douche, use tampons, or have sex until you stop spotting, usually about 2 weeks. . Take your pain medicine when you need it. The medicine may not work as well if the pain is bad.  Take the medicines you were taking before surgery. Other medications you might need are pain medications (ibuprofen), medications for constipation (Colace) and nausea medications (Zofran).       Coping with Pregnancy Loss Pregnancy loss can happen any time during a pregnancy. Often the cause is not known. It is rarely because of anything you did. Pregnancy loss in early pregnancy (during the first trimester) is called a miscarriage. This type of pregnancy loss is the most common.   Any pregnancy loss can be devastating. You will need to recover both physically and emotionally. Most women are able to get pregnant again after a pregnancy loss and deliver a healthy baby.  How to manage emotional recovery  Pregnancy loss is very hard emotionally. You may feel many different emotions while you grieve. You may feel sad and angry. You may also feel guilty. It is normal to have periods of crying. Emotional recovery can take longer than physical recovery. It is different for everyone.   Some women may feel back to normal quickly and others take longer. Taking these steps can help you cope:  Remember that it is unlikely you did anything to cause the pregnancy loss.  Share your thoughts and feelings with friends, family, and your partner. Remember that your partner is also recovering emotionally.  Make sure you have a good support system, and do not spend too much time alone.  Meet with a pregnancy loss counselor or join a pregnancy loss support group.  Get enough sleep and eat a healthy diet. Return to regular exercise  when you have recovered physically.  Do not use drugs or alcohol to manage your emotions.  Consider seeing a mental health professional to help you recover emotionally.  Ask a friend or loved one to help you decide what to do with any clothing and nursery items you received for your baby.  How to recognize emotional stress It is normal to have emotional stress after a pregnancy loss. But emotional stress that lasts a long time or becomes severe requires treatment. Watch out for these signs of severe emotional stress:  Sadness, anger, or guilt that is not going away and is interfering with your normal activities.  Relationship problems that have occurred or gotten worse since the pregnancy loss.  Signs of depression that last longer than 2 weeks. These may include: ? Sadness. ? Anxiety. ? Hopelessness. ? Loss of interest in activities you enjoy. ? Inability to concentrate. ? Trouble sleeping or sleeping too much. ? Loss of appetite or overeating. ? Thoughts of death or of hurting yourself. Follow these instructions at home: Medicines  Take over-the-counter and prescription medicines only as told by your health care provider. Activity  Rest at home until your energy level returns. Return to your normal activities as told by your health care provider. Ask your health care provider what activities are safe for you. General instructions  Keep all follow-up visits as told by your health care provider. This is important.  It may be helpful to meet with others who have experienced pregnancy loss. Ask your health care provider about support groups and resources.  To help you and your partner with the process of grieving, talk with your health care provider or seek counseling.  When you are ready, meet with your health care provider to discuss steps to take for a future pregnancy. Where to find more information  [place] Department of Health and Programmer, systems on Women's Health:  [place]  American Pregnancy Association: www.americanpregnancy.org Contact a health care provider if:  You continue to experience grief, sadness, or lack of motivation for everyday activities, and those feelings do not improve over time.  You are struggling to recover emotionally, especially if you are using alcohol or substances to help. Get help right away if:  You have thoughts of hurting yourself or others. If you ever feel like you may hurt yourself or others, or have thoughts about taking your own life, get help right away. You can go to your nearest emergency department or call:  Your local emergency services (911 in the [place]  A suicide crisis helpline, such as the [employer] at 808-222-4481. This is open 24 hours a day. Summary  Any pregnancy loss can be difficult physically and emotionally.  You may experience many different emotions while you grieve. Emotional recovery can last longer than physical recovery.  It is normal to have emotional stress after a pregnancy loss. But emotional stress that lasts a  long time or becomes severe requires treatment.  See your health care provider if you are struggling emotionally after a pregnancy loss. This information is not intended to replace advice given to you by your health care provider. Make sure you discuss any questions you have with your health care provider. Document Released: 11/24/2017 Document Revised: 11/24/2017 Document Reviewed: 11/24/2017 Elsevier Interactive Patient Education  2019 [employer].  AMBULATORY SURGERY  DISCHARGE INSTRUCTIONS   1) The drugs that you were given will stay in your system until tomorrow so for the next 24 hours you should not:  A) Drive an automobile B) Make any legal decisions C) Drink any alcoholic beverage   2) You may resume regular meals tomorrow.  Today it is better to start with liquids and gradually work up to solid foods.  You may  eat anything you prefer, but it is better to start with liquids, then soup and crackers, and gradually work up to solid foods.   3) Please notify your doctor immediately if you have any unusual bleeding, trouble breathing, redness and pain at the surgery site, drainage, fever, or pain not relieved by medication.    4) Additional Instructions:    Please contact your physician with any problems or Same Day Surgery at [phone], Monday through Friday 6 am to 4 pm, or [place] [facility] number at [phone].

## 2020-04-21 NOTE — Op Note (Signed)
Operative Report Suction Dilation and Curettage   Indications: Missed abortion   Pre-operative Diagnosis: Missed abortion at 7 weeks  Post-operative Diagnosis: same.  Procedure: 1. Suction D&C  Surgeon: Christeen Douglas  Assistant(s):  None  Anesthesia: General LMA anesthesia  Estimated Blood Loss:  less than 50 mL         Total IV Fluids:  Urine Output: 46ml         Specimens: Products of conception         Complications:  None; patient tolerated the procedure well.         Disposition: PACU - hemodynamically stable.         Condition: stable  Findings: Uterus measuring 7 weeks; normal cervix, vagina, perineum.   Indication for procedure/Consents: 24 y.o. G1P0  here for scheduled surgery for the aforementioned diagnoses.    Risks of surgery were discussed with the patient including but not limited to: bleeding which may require transfusion; infection which may require antibiotics; injury to uterus or surrounding organs; intrauterine scarring which may impair future fertility; need for additional procedures including laparotomy or laparoscopy; and other postoperative/anesthesia complications. Written informed consent was obtained.    Procedure Details:   The patient received oral antibiotics while in the preoperative area.  She was then taken to the operating room where general anesthesia was administered and was found to be adequate.  After a formal and adequate timeout was performed, she was placed in the dorsal lithotomy position and examined with the above findings. She was then prepped and draped in the sterile manner.   Her bladder was catheterized for an estimated amount of clear, yellow urine. A speculum was then placed in the patient's vagina and a single tooth tenaculum was applied to the anterior lip of the cervix.    No uterine sounding was performed on this pregnant uterus. Her cervix was serially dilated to accommodate a 69mm sized flexible suction  curette.  A sharp curettage was then performed until there was a gritty texture in all four quadrants.  The tenaculum was removed from the anterior lip of the cervix and the vaginal speculum was removed after noting good hemostasis. The patient tolerated the procedure well and was taken to the recovery area awake, extubated and in stable condition.  The patient will be discharged to home as per PACU criteria.  She will receive another dose of oral antibiotics prior to discharge. Routine postoperative instructions given.  She was prescribed Percocet, Ibuprofen and Colace.  She will follow up in the clinic in two weeks for postoperative evaluation.

## 2020-04-21 NOTE — Anesthesia Procedure Notes (Signed)
Procedure Name: LMA Insertion Date/Time: 04/21/2020 1:56 AM Performed by: Stormy Fabian, CRNA Pre-anesthesia Checklist: Patient identified, Patient being monitored, Timeout performed, Emergency Drugs available and Suction available Patient Re-evaluated:Patient Re-evaluated prior to induction Oxygen Delivery Method: Circle system utilized Preoxygenation: Pre-oxygenation with 100% oxygen Induction Type: IV induction Ventilation: Mask ventilation without difficulty LMA: LMA inserted LMA Size: 4.0 Tube type: Oral Number of attempts: 1 Placement Confirmation: positive ETCO2 and breath sounds checked- equal and bilateral Tube secured with: Tape Dental Injury: Teeth and Oropharynx as per pre-operative assessment

## 2020-04-21 NOTE — Interval H&P Note (Signed)
History and Physical Interval Note:  04/21/2020 1:26 PM  Susan Cannon  has presented today for surgery, with the diagnosis of missed abortion.  The various methods of treatment have been discussed with the patient and family. After consideration of risks, benefits and other options for treatment, the patient has consented to  Procedure(s): DILATATION AND EVACUATION (SUCTION D&C) (N/A) as a surgical intervention.  The patient's history has been reviewed, patient examined, no change in status, stable for surgery.  I have reviewed the patient's chart and labs.  Questions were answered to the patient's satisfaction.     Christeen Douglas

## 2020-04-22 ENCOUNTER — Encounter: Payer: Self-pay | Admitting: Obstetrics and Gynecology

## 2020-04-22 NOTE — Anesthesia Postprocedure Evaluation (Signed)
Anesthesia Post Note  Patient: Susan Cannon  Procedure(s) Performed: DILATATION AND EVACUATION (SUCTION D&C) (N/A Vagina )  Patient location during evaluation: PACU Anesthesia Type: General Level of consciousness: awake and alert Pain management: pain level controlled Vital Signs Assessment: post-procedure vital signs reviewed and stable Respiratory status: spontaneous breathing, nonlabored ventilation, respiratory function stable and patient connected to nasal cannula oxygen Cardiovascular status: blood pressure returned to baseline and stable Postop Assessment: no apparent nausea or vomiting Anesthetic complications: no   No complications documented.   Last Vitals:  Vitals:   04/21/20 1620 04/21/20 1630  BP: 112/79 (!) 129/79  Pulse:  83  Resp:  16  Temp:  (!) 36.4 C  SpO2:  100%    Last Pain:  Vitals:   04/21/20 1630  TempSrc: Tympanic  PainSc: 0-No pain                 Lenard Simmer

## 2020-04-23 LAB — SURGICAL PATHOLOGY

## 2020-07-15 DIAGNOSIS — O468X1 Other antepartum hemorrhage, first trimester: Secondary | ICD-10-CM | POA: Diagnosis not present

## 2020-07-15 DIAGNOSIS — O418X11 Other specified disorders of amniotic fluid and membranes, first trimester, fetus 1: Secondary | ICD-10-CM | POA: Diagnosis not present

## 2020-07-15 DIAGNOSIS — N912 Amenorrhea, unspecified: Secondary | ICD-10-CM | POA: Diagnosis not present

## 2020-08-11 DIAGNOSIS — O468X1 Other antepartum hemorrhage, first trimester: Secondary | ICD-10-CM | POA: Diagnosis not present

## 2020-08-11 DIAGNOSIS — O418X11 Other specified disorders of amniotic fluid and membranes, first trimester, fetus 1: Secondary | ICD-10-CM | POA: Diagnosis not present

## 2020-08-13 ENCOUNTER — Telehealth: Payer: Self-pay | Admitting: Obstetrics and Gynecology

## 2020-08-13 ENCOUNTER — Encounter: Payer: Self-pay | Admitting: *Deleted

## 2020-08-13 ENCOUNTER — Other Ambulatory Visit: Payer: Self-pay

## 2020-08-13 ENCOUNTER — Emergency Department: Payer: 59

## 2020-08-13 DIAGNOSIS — Z3A11 11 weeks gestation of pregnancy: Secondary | ICD-10-CM | POA: Diagnosis not present

## 2020-08-13 DIAGNOSIS — O4691 Antepartum hemorrhage, unspecified, first trimester: Secondary | ICD-10-CM | POA: Diagnosis not present

## 2020-08-13 DIAGNOSIS — O208 Other hemorrhage in early pregnancy: Secondary | ICD-10-CM | POA: Diagnosis not present

## 2020-08-13 DIAGNOSIS — O368910 Maternal care for other specified fetal problems, first trimester, not applicable or unspecified: Secondary | ICD-10-CM | POA: Diagnosis not present

## 2020-08-13 LAB — URINALYSIS, COMPLETE (UACMP) WITH MICROSCOPIC
Bacteria, UA: NONE SEEN
Bilirubin Urine: NEGATIVE
Glucose, UA: NEGATIVE mg/dL
Ketones, ur: NEGATIVE mg/dL
Leukocytes,Ua: NEGATIVE
Nitrite: NEGATIVE
Protein, ur: 100 mg/dL — AB
RBC / HPF: >50 RBC/hpf — ABNORMAL HIGH (ref 0–5)
Specific Gravity, Urine: 1.016 (ref 1.005–1.030)
pH: 6 (ref 5.0–8.0)

## 2020-08-13 LAB — CBC WITH DIFFERENTIAL/PLATELET
Abs Immature Granulocytes: 0.06 10*3/uL (ref 0.00–0.07)
Basophils Absolute: 0 10*3/uL (ref 0.0–0.1)
Basophils Relative: 0 %
Eosinophils Absolute: 0.3 10*3/uL (ref 0.0–0.5)
Eosinophils Relative: 2 %
HCT: 40.2 % (ref 36.0–46.0)
Hemoglobin: 13.9 g/dL (ref 12.0–15.0)
Immature Granulocytes: 0 %
Lymphocytes Relative: 12 %
Lymphs Abs: 2 10*3/uL (ref 0.7–4.0)
MCH: 31.2 pg (ref 26.0–34.0)
MCHC: 34.6 g/dL (ref 30.0–36.0)
MCV: 90.3 fL (ref 80.0–100.0)
Monocytes Absolute: 0.6 10*3/uL (ref 0.1–1.0)
Monocytes Relative: 4 %
Neutro Abs: 14.4 10*3/uL — ABNORMAL HIGH (ref 1.7–7.7)
Neutrophils Relative %: 82 %
Platelets: 288 10*3/uL (ref 150–400)
RBC: 4.45 MIL/uL (ref 3.87–5.11)
RDW: 12.1 % (ref 11.5–15.5)
WBC: 17.5 10*3/uL — ABNORMAL HIGH (ref 4.0–10.5)
nRBC: 0 % (ref 0.0–0.2)

## 2020-08-13 LAB — COMPREHENSIVE METABOLIC PANEL
ALT: 13 U/L (ref 0–44)
AST: 19 U/L (ref 15–41)
Albumin: 3.7 g/dL (ref 3.5–5.0)
Alkaline Phosphatase: 59 U/L (ref 38–126)
Anion gap: 10 (ref 5–15)
BUN: 9 mg/dL (ref 6–20)
CO2: 22 mmol/L (ref 22–32)
Calcium: 8.7 mg/dL — ABNORMAL LOW (ref 8.9–10.3)
Chloride: 104 mmol/L (ref 98–111)
Creatinine, Ser: 0.56 mg/dL (ref 0.44–1.00)
GFR, Estimated: >60 mL/min (ref >60)
Glucose, Bld: 94 mg/dL (ref 70–99)
Potassium: 3.5 mmol/L (ref 3.5–5.1)
Sodium: 136 mmol/L (ref 135–145)
Total Bilirubin: 0.4 mg/dL (ref 0.3–1.2)
Total Protein: 6.9 g/dL (ref 6.5–8.1)

## 2020-08-13 LAB — HCG, QUANTITATIVE, PREGNANCY: hCG, Beta Chain, Quant, S: 142616 m[IU]/mL — ABNORMAL HIGH (ref <5)

## 2020-08-13 LAB — POC URINE PREG, ED: Preg Test, Ur: POSITIVE — AB

## 2020-08-13 NOTE — Telephone Encounter (Signed)
Pt called with new onset bleeding that started while in the shower this evening. Initially noted pink tinged water, then dried off and is having constant dark red bleeding, more than spotting. Denies any cramping or pain.  Hx miscarriage in July 2021 blood type O Pos.  Viable pregnancy was confirmed 1 month ago with [place] on 07/15/20, IUP at [redacted]w[redacted]d, with subchorionic hemorrhage seen. Followup US done on 08/11/20: and [facility] Rt of IUP=3.36 x 1.32 x 1.23cm, with viable IUP at [redacted]w[redacted]d.   Discussed options to wait at home and call office in AM for same day appt and [place] vs coming to ER tonight for eval.   Reassurance given to pt that VB occurs frequently in early pregnancy, and pt has a known [facility]. Also discussed incidence of miscarriage and it is neither predictable nor can be prevented if occurring.   Randa Ngo, CNM

## 2020-08-13 NOTE — ED Notes (Signed)
poct pregnancy Positive 

## 2020-08-13 NOTE — ED Triage Notes (Signed)
Pt reports she is approx. [redacted] weeks pregnant.  Pt has vag bleeding.  G2p0a1.  Pt denies abd pain or cramping.  Pt treated at [facility] and had recent u/s that was normal per pt.  Pt tearful.

## 2020-08-14 ENCOUNTER — Emergency Department
Admission: EM | Admit: 2020-08-14 | Discharge: 2020-08-14 | Disposition: A | Discharge status: Home / Self Care | Payer: 59 | Attending: Emergency Medicine | Admitting: Emergency Medicine

## 2020-08-14 DIAGNOSIS — O200 Threatened abortion: Secondary | ICD-10-CM

## 2020-08-14 DIAGNOSIS — O418X10 Other specified disorders of amniotic fluid and membranes, first trimester, not applicable or unspecified: Secondary | ICD-10-CM

## 2020-08-14 DIAGNOSIS — O468X1 Other antepartum hemorrhage, first trimester: Secondary | ICD-10-CM

## 2020-08-14 NOTE — Discharge Instructions (Signed)
Pelvic rest until 48 hours of no bleed. Call your OB in the morning.  Return to the ED for severe bleeding, feeling like you are going to pass out, shortness of breath or chest pain.

## 2020-08-14 NOTE — ED Notes (Addendum)
Pt states that today she started having sudden onset heavy bleeding that has since become less heavy. Pt has hx of miscarriage and is concerned she is having one again. Per pt, there was a hematoma that pcp said she may pass or may absorb, but that she's still concerned about miscarriage. Denies pain or abd cramping.

## 2020-08-14 NOTE — ED Provider Notes (Signed)
[facility] Emergency Department Provider Note  ____________________________________________  Time seen: Approximately 1:45 AM  I have reviewed the triage vital signs and the nursing notes.   HISTORY  Chief Complaint Vaginal Bleeding   HPI Susan Cannon is a 24 y.o. female G2, P0 currently at [redacted] weeks gestational age who presents for evaluation of vaginal bleeding.  Patient reports that started this evening.  She had a large gush of blood and blood clots while in the shower.  She then soaked through 1 pad.  No syncope, abdominal pain, chest pain or shortness of breath.  No history of bleeding disorders.  This is patient's second pregnancy.  The first 1 she had a miscarriage.  Patient has established care for this pregnancy and has had her full prenatal evaluation already.   Past Medical History:  Diagnosis Date   Heart murmur    History of chicken pox    24 y.o.    There are no problems to display for this patient.   Past Surgical History:  Procedure Laterality Date   DILATION AND EVACUATION N/A 04/21/2020   Procedure: DILATATION AND EVACUATION (SUCTION D&C);  Surgeon: Christeen Douglas, MD;  Location: [facility] ORS;  Service: Gynecology;  Laterality: N/A;   WISDOM TOOTH EXTRACTION      Prior to Admission medications   Medication Sig Start Date End Date Taking? Authorizing Provider  acetaminophen (TYLENOL) 325 MG tablet Take 650 mg by mouth every 6 (six) hours as needed for headache.    [provider]  ibuprofen (ADVIL) 200 MG tablet Take 400 mg by mouth every 6 (six) hours as needed for headache.    [provider]  ibuprofen (ADVIL) 800 MG tablet Take 1 tablet (800 mg total) by mouth every 8 (eight) hours as needed for moderate pain or cramping. 04/21/20   Christeen Douglas, MD  loratadine (CLARITIN) 10 MG tablet Take 10 mg by mouth daily as needed for allergies.    [provider]    Allergies Sulfa antibiotics  Family  History  Problem Relation Age of Onset   Colon cancer Paternal Grandfather     Social History Social History   Tobacco Use   Smoking status: Never Smoker   Smokeless tobacco: Never Used  Building services engineer Use: Never used  Substance Use Topics   Alcohol use: No   Drug use: No    Review of Systems  Constitutional: Negative for fever. Eyes: Negative for visual changes. ENT: Negative for sore throat. Neck: No neck pain  Cardiovascular: Negative for chest pain. Respiratory: Negative for shortness of breath. Gastrointestinal: Negative for abdominal pain, vomiting or diarrhea. Genitourinary: Negative for dysuria. + vaginal bleeding Musculoskeletal: Negative for back pain. Skin: Negative for rash. Neurological: Negative for headaches, weakness or numbness. Psych: No SI or HI  ____________________________________________   PHYSICAL EXAM:  VITAL SIGNS: ED Triage Vitals  Enc Vitals Group     BP 08/13/20 2036 127/85     Pulse Rate 08/13/20 2036 (!) 111     Resp 08/13/20 2036 18     Temp 08/13/20 2036 98.6 F (37 C)     Temp Source 08/13/20 2036 Oral     SpO2 08/13/20 2036 100 %     Weight 08/13/20 2037 150 lb (68 kg)     Height 08/13/20 2037 5\' 5"  (1.651 m)     Head Circumference --      Peak Flow --      Pain Score 08/13/20 2037  0     Pain Loc --      Pain Edu? --      Excl. in GC? --     Constitutional: Alert and oriented. Well appearing and in no apparent distress. HEENT:      Head: Normocephalic and atraumatic.         Eyes: Conjunctivae are normal. Sclera is non-icteric.       Mouth/Throat: Mucous membranes are moist.       Neck: Supple with no signs of meningismus. Cardiovascular: Regular rate and rhythm. No murmurs, gallops, or rubs.  Respiratory: Normal respiratory effort.  Gastrointestinal: Soft, non tender Pelvic exam: Normal external genitalia, no rashes or lesions. Os closed with small amount of blood in the vaginal vault coming from the os.    Musculoskeletal: No edema, cyanosis, or erythema of extremities. Neurologic: Normal speech and language. Face is symmetric. Moving all extremities. No gross focal neurologic deficits are appreciated. Skin: Skin is warm, dry and intact. No rash noted. Psychiatric: Mood and affect are normal. Speech and behavior are normal.  ____________________________________________   LABS (all labs ordered are listed, but only abnormal results are displayed)  Labs Reviewed  CBC WITH DIFFERENTIAL/PLATELET - Abnormal; Notable for the following components:      Result Value   WBC 17.5 (*)    Neutro Abs 14.4 (*)    All other components within normal limits  COMPREHENSIVE METABOLIC PANEL - Abnormal; Notable for the following components:   Calcium 8.7 (*)    All other components within normal limits  URINALYSIS, COMPLETE (UACMP) WITH MICROSCOPIC - Abnormal; Notable for the following components:   Color, Urine YELLOW (*)    APPearance CLEAR (*)    Hgb urine dipstick LARGE (*)    Protein, ur 100 (*)    RBC / HPF >50 (*)    All other components within normal limits  HCG, QUANTITATIVE, PREGNANCY - Abnormal; Notable for the following components:   hCG, Beta Chain, Quant, S 142,616 (*)    All other components within normal limits  POC URINE PREG, ED - Abnormal; Notable for the following components:   Preg Test, Ur POSITIVE (*)    All other components within normal limits   ____________________________________________  EKG  none  ____________________________________________  RADIOLOGY  I have personally reviewed the images performed during this visit and I agree with the Radiologist's read.   Interpretation by Radiologist:  US OB Comp Less 14 Wks  Result Date: 08/14/2020 CLINICAL DATA:  Bleeding EXAM: OBSTETRIC <14 WK ULTRASOUND TECHNIQUE: Transabdominal ultrasound was performed for evaluation of the gestation as well as the maternal uterus and adnexal regions. COMPARISON:  None. FINDINGS:  Intrauterine gestational sac: Single Yolk sac:  Not Visualized. Embryo:  Visualized. Cardiac Activity: Visualized. Heart Rate: 173 bpm MSD:    mm    w     d CRL:   51 mm   11 w 6 d                  [place] EDC: 02/26/2021 Subchorionic hemorrhage:  Moderate to large subchorionic hemorrhage Maternal uterus/adnexae: No adnexal mass or free fluid IMPRESSION: Eleven week 6 day intrauterine pregnancy. Fetal heart rate 173 beats per minute. Moderate to large subchorionic hemorrhage. Electronically Signed   By: Charlett Nose M.D.   On: 08/14/2020 01:18     ____________________________________________   PROCEDURES  Procedure(s) performed: None Procedures Critical Care performed:  None ____________________________________________   INITIAL IMPRESSION / ASSESSMENT AND PLAN / ED  COURSE  24 y.o. female G2, P0 currently at [redacted] weeks gestational age who presents for evaluation of vaginal bleeding.  Patient is well-appearing in no distress, hemodynamically stable, stable hemoglobin of 13.9.  Blood type of O+ no indication for RhoGam.  No abdominal tenderness.  Pelvic ultrasound visualized by me showing an IUP with normal fetal heart rate, confirmed by radiology who also describes a large subchorionic hemorrhage.  History gathered from patient and her husband was at bedside.  These findings were discussed with both of them.  Recommended pelvic rest and close follow-up with OB.  Old medical records reviewed from prior visits to her OB for this pregnancy.  Discussed signs and symptoms of acute blood loss anemia and recommended return to the emergency room if these develop.      _____________________________________________ Please note:  Patient was evaluated in Emergency Department today for the symptoms described in the history of present illness. Patient was evaluated in the context of the global COVID-19 pandemic, which necessitated consideration that the patient might be at risk for infection with the SARS-CoV-2  virus that causes COVID-19. Institutional protocols and algorithms that pertain to the evaluation of patients at risk for COVID-19 are in a state of rapid change based on information released by regulatory bodies including the CDC and federal and state organizations. These policies and algorithms were followed during the patient's care in the ED.  Some ED evaluations and interventions may be delayed as a result of limited staffing during the pandemic.   [place] Controlled Substance Database was reviewed by me. ____________________________________________   FINAL CLINICAL IMPRESSION(S) / ED DIAGNOSES   Final diagnoses:  Subchorionic hemorrhage of placenta in first trimester, single or unspecified fetus  Threatened miscarriage      NEW MEDICATIONS STARTED DURING THIS VISIT:  ED Discharge Orders    None       Note:  This document was prepared using Dragon voice recognition software and may include unintentional dictation errors.    Nita Sickle, MD 08/14/20 [phone]

## 2020-08-18 DIAGNOSIS — Z135 Encounter for screening for eye and ear disorders: Secondary | ICD-10-CM | POA: Diagnosis not present

## 2020-08-18 DIAGNOSIS — H52223 Regular astigmatism, bilateral: Secondary | ICD-10-CM | POA: Diagnosis not present

## 2020-08-25 DIAGNOSIS — O418X11 Other specified disorders of amniotic fluid and membranes, first trimester, fetus 1: Secondary | ICD-10-CM | POA: Diagnosis not present

## 2020-08-25 DIAGNOSIS — O468X1 Other antepartum hemorrhage, first trimester: Secondary | ICD-10-CM | POA: Diagnosis not present

## 2020-08-26 DIAGNOSIS — Z3401 Encounter for supervision of normal first pregnancy, first trimester: Secondary | ICD-10-CM | POA: Diagnosis not present

## 2020-08-26 DIAGNOSIS — O418X11 Other specified disorders of amniotic fluid and membranes, first trimester, fetus 1: Secondary | ICD-10-CM | POA: Diagnosis not present

## 2020-08-26 DIAGNOSIS — Z113 Encounter for screening for infections with a predominantly sexual mode of transmission: Secondary | ICD-10-CM | POA: Diagnosis not present

## 2020-08-26 DIAGNOSIS — O468X1 Other antepartum hemorrhage, first trimester: Secondary | ICD-10-CM | POA: Diagnosis not present

## 2020-08-26 LAB — OB RESULTS CONSOLE VARICELLA ZOSTER ANTIBODY, IGG: Varicella: IMMUNE

## 2020-08-26 LAB — OB RESULTS CONSOLE RUBELLA ANTIBODY, IGM: Rubella: IMMUNE

## 2020-08-26 LAB — OB RESULTS CONSOLE HEPATITIS B SURFACE ANTIGEN: Hepatitis B Surface Ag: NEGATIVE

## 2020-08-28 ENCOUNTER — Other Ambulatory Visit: Payer: Self-pay | Admitting: Obstetrics and Gynecology

## 2020-08-28 ENCOUNTER — Ambulatory Visit: Payer: 59 | Attending: Maternal & Fetal Medicine

## 2020-08-28 ENCOUNTER — Other Ambulatory Visit: Payer: Self-pay

## 2020-08-28 DIAGNOSIS — O208 Other hemorrhage in early pregnancy: Secondary | ICD-10-CM | POA: Diagnosis not present

## 2020-08-28 DIAGNOSIS — O4401 Placenta previa specified as without hemorrhage, first trimester: Secondary | ICD-10-CM | POA: Insufficient documentation

## 2020-08-28 DIAGNOSIS — O418X11 Other specified disorders of amniotic fluid and membranes, first trimester, fetus 1: Secondary | ICD-10-CM

## 2020-08-28 DIAGNOSIS — Z3A13 13 weeks gestation of pregnancy: Secondary | ICD-10-CM | POA: Diagnosis not present

## 2020-08-28 DIAGNOSIS — O4433 Partial placenta previa with hemorrhage, third trimester: Secondary | ICD-10-CM

## 2020-09-17 DIAGNOSIS — O468X2 Other antepartum hemorrhage, second trimester: Secondary | ICD-10-CM | POA: Diagnosis not present

## 2020-09-17 DIAGNOSIS — O418X20 Other specified disorders of amniotic fluid and membranes, second trimester, not applicable or unspecified: Secondary | ICD-10-CM | POA: Diagnosis not present

## 2020-09-27 NOTE — L&D Delivery Note (Signed)
Delivery Note  First Stage: Labor onset: 0001 Augmentation: AROM, Pitocin Analgesia /Anesthesia intrapartum: none AROM at 0034  Second Stage: Complete dilation at 0246 Onset of pushing at 0246 FHR second stage Cat II- variable decels with pushing to 80bpm  Delivery of a viable female infant on 02/12/21 at 0337 by CNM delivery of fetal head in LOA position with restitution to LOT. No nuchal cord;  Anterior then posterior shoulders delivered easily with gentle downward traction. Baby placed on mom's chest, and attended to by peds.  Cord double clamped after cessation of pulsation, cut by FOB Cord blood sample collected   Third Stage: Placenta delivered spontaneously intact with 3VC @ 0341 Placenta disposition: to pathology Uterine tone Firm with massage, Pitocin bolus given, then fundus again firmed with massage. Cytotec PR given.   Bilateral periurethral lacs, 2nd deg vaginal laceration identified  Anesthesia for repair: Lidocaine Repair in usual fashion to achieve cosmesis and hemostasis, 3-0 vicryl SH and 2-0 Vicryl CT-1.  Est. Blood Loss (mL): 350  Complications: none  Mom to postpartum.  Baby to Couplet care / Skin to Skin.  Newborn: Birth Weight: pending  Apgar Scores: 8/9 Feeding planned: breast

## 2020-10-02 ENCOUNTER — Other Ambulatory Visit: Payer: Self-pay | Admitting: Obstetrics and Gynecology

## 2020-10-02 ENCOUNTER — Other Ambulatory Visit: Payer: Self-pay | Admitting: Family Medicine

## 2020-10-02 DIAGNOSIS — O4400 Placenta previa specified as without hemorrhage, unspecified trimester: Secondary | ICD-10-CM

## 2020-10-02 DIAGNOSIS — O418X20 Other specified disorders of amniotic fluid and membranes, second trimester, not applicable or unspecified: Secondary | ICD-10-CM

## 2020-10-07 ENCOUNTER — Other Ambulatory Visit: Payer: 59

## 2020-12-11 DIAGNOSIS — O09892 Supervision of other high risk pregnancies, second trimester: Secondary | ICD-10-CM | POA: Diagnosis not present

## 2020-12-11 DIAGNOSIS — Z23 Encounter for immunization: Secondary | ICD-10-CM | POA: Diagnosis not present

## 2020-12-15 DIAGNOSIS — M5489 Other dorsalgia: Secondary | ICD-10-CM | POA: Diagnosis not present

## 2020-12-15 DIAGNOSIS — R609 Edema, unspecified: Secondary | ICD-10-CM | POA: Diagnosis not present

## 2021-01-19 DIAGNOSIS — O26843 Uterine size-date discrepancy, third trimester: Secondary | ICD-10-CM | POA: Diagnosis not present

## 2021-01-20 DIAGNOSIS — Z3483 Encounter for supervision of other normal pregnancy, third trimester: Secondary | ICD-10-CM | POA: Diagnosis not present

## 2021-01-20 DIAGNOSIS — Z3482 Encounter for supervision of other normal pregnancy, second trimester: Secondary | ICD-10-CM | POA: Diagnosis not present

## 2021-02-04 DIAGNOSIS — O09893 Supervision of other high risk pregnancies, third trimester: Secondary | ICD-10-CM | POA: Diagnosis not present

## 2021-02-04 LAB — OB RESULTS CONSOLE RPR: RPR: NONREACTIVE

## 2021-02-04 LAB — OB RESULTS CONSOLE GC/CHLAMYDIA
Chlamydia: NEGATIVE
Gonorrhea: NEGATIVE

## 2021-02-04 LAB — OB RESULTS CONSOLE HIV ANTIBODY (ROUTINE TESTING): HIV: NONREACTIVE

## 2021-02-04 LAB — OB RESULTS CONSOLE GBS: GBS: NEGATIVE

## 2021-02-09 DIAGNOSIS — O09893 Supervision of other high risk pregnancies, third trimester: Secondary | ICD-10-CM | POA: Diagnosis not present

## 2021-02-11 ENCOUNTER — Other Ambulatory Visit: Payer: Self-pay

## 2021-02-11 ENCOUNTER — Inpatient Hospital Stay
Admission: EM | Admit: 2021-02-11 | Discharge: 2021-02-13 | DRG: 807 | Disposition: A | Discharge status: Home / Self Care | Payer: 59 | Attending: Obstetrics and Gynecology | Admitting: Obstetrics and Gynecology

## 2021-02-11 ENCOUNTER — Other Ambulatory Visit: Payer: Self-pay | Admitting: Obstetrics and Gynecology

## 2021-02-11 ENCOUNTER — Encounter: Payer: Self-pay | Admitting: Obstetrics and Gynecology

## 2021-02-11 DIAGNOSIS — O1493 Unspecified pre-eclampsia, third trimester: Secondary | ICD-10-CM | POA: Diagnosis present

## 2021-02-11 DIAGNOSIS — Z20822 Contact with and (suspected) exposure to covid-19: Secondary | ICD-10-CM | POA: Diagnosis not present

## 2021-02-11 DIAGNOSIS — O1404 Mild to moderate pre-eclampsia, complicating childbirth: Secondary | ICD-10-CM | POA: Diagnosis not present

## 2021-02-11 DIAGNOSIS — Z3A37 37 weeks gestation of pregnancy: Secondary | ICD-10-CM | POA: Diagnosis not present

## 2021-02-11 LAB — COMPREHENSIVE METABOLIC PANEL
ALT: 14 U/L (ref 0–44)
AST: 20 U/L (ref 15–41)
Albumin: 3 g/dL — ABNORMAL LOW (ref 3.5–5.0)
Alkaline Phosphatase: 131 U/L — ABNORMAL HIGH (ref 38–126)
Anion gap: 7 (ref 5–15)
BUN: 13 mg/dL (ref 6–20)
CO2: 21 mmol/L — ABNORMAL LOW (ref 22–32)
Calcium: 8.7 mg/dL — ABNORMAL LOW (ref 8.9–10.3)
Chloride: 109 mmol/L (ref 98–111)
Creatinine, Ser: 0.51 mg/dL (ref 0.44–1.00)
GFR, Estimated: >60 mL/min (ref >60)
Glucose, Bld: 90 mg/dL (ref 70–99)
Potassium: 3.5 mmol/L (ref 3.5–5.1)
Sodium: 137 mmol/L (ref 135–145)
Total Bilirubin: 0.4 mg/dL (ref 0.3–1.2)
Total Protein: 6.3 g/dL — ABNORMAL LOW (ref 6.5–8.1)

## 2021-02-11 LAB — RESP PANEL BY RT-PCR (FLU A&B, COVID) ARPGX2
Influenza A by PCR: NEGATIVE
Influenza B by PCR: NEGATIVE
SARS Coronavirus 2 by RT PCR: NEGATIVE

## 2021-02-11 LAB — CBC
HCT: 36.9 % (ref 36.0–46.0)
Hemoglobin: 13.1 g/dL (ref 12.0–15.0)
MCH: 32.3 pg (ref 26.0–34.0)
MCHC: 35.5 g/dL (ref 30.0–36.0)
MCV: 91.1 fL (ref 80.0–100.0)
Platelets: 258 10*3/uL (ref 150–400)
RBC: 4.05 MIL/uL (ref 3.87–5.11)
RDW: 13.2 % (ref 11.5–15.5)
WBC: 12.2 10*3/uL — ABNORMAL HIGH (ref 4.0–10.5)
nRBC: 0 % (ref 0.0–0.2)

## 2021-02-11 LAB — PROTEIN / CREATININE RATIO, URINE
Creatinine, Urine: 97 mg/dL
Protein Creatinine Ratio: 0.39 mg/mg{Cre} — ABNORMAL HIGH (ref 0.00–0.15)
Total Protein, Urine: 38 mg/dL

## 2021-02-11 LAB — TYPE AND SCREEN
ABO/RH(D): O POS
Antibody Screen: NEGATIVE

## 2021-02-11 MED ORDER — LACTATED RINGERS IV SOLN: INTRAVENOUS | Status: DC

## 2021-02-11 MED ORDER — LACTATED RINGERS IV SOLN: 500.0000 mL | INTRAVENOUS | Status: DC | PRN

## 2021-02-11 MED ORDER — MISOPROSTOL 25 MCG QUARTER TABLET
25.0000 ug | ORAL_TABLET | ORAL | Status: DC | PRN
  Filled 2021-02-11: qty 1

## 2021-02-11 MED ORDER — AMMONIA AROMATIC IN INHA
RESPIRATORY_TRACT | Status: AC
  Filled 2021-02-11: qty 10

## 2021-02-11 MED ORDER — LIDOCAINE HCL (PF) 1 % IJ SOLN
30.0000 mL | INTRAMUSCULAR | Status: AC | PRN
  Administered 2021-02-12: 30 mL via SUBCUTANEOUS

## 2021-02-11 MED ORDER — FENTANYL CITRATE (PF) 100 MCG/2ML IJ SOLN
50.0000 ug | INTRAMUSCULAR | Status: DC | PRN
Start: 2021-02-11 — End: 2021-02-12
  Administered 2021-02-12: 100 ug via INTRAVENOUS
  Filled 2021-02-11: qty 2

## 2021-02-11 MED ORDER — OXYTOCIN BOLUS FROM INFUSION
333.0000 mL | Freq: Once | INTRAVENOUS | Status: AC
  Administered 2021-02-12: 333 mL via INTRAVENOUS

## 2021-02-11 MED ORDER — OXYTOCIN-SODIUM CHLORIDE 30-0.9 UT/500ML-% IV SOLN
1.0000 m[IU]/min | INTRAVENOUS | Status: DC
  Administered 2021-02-11: 2 m[IU]/min via INTRAVENOUS
  Filled 2021-02-11: qty 500
  Filled 2021-02-11: qty 1000

## 2021-02-11 MED ORDER — ACETAMINOPHEN 325 MG PO TABS
650.0000 mg | ORAL_TABLET | ORAL | Status: DC | PRN
Start: 2021-02-11 — End: 2021-02-12

## 2021-02-11 MED ORDER — OXYTOCIN-SODIUM CHLORIDE 30-0.9 UT/500ML-% IV SOLN: 2.5000 [IU]/h | INTRAVENOUS | Status: DC

## 2021-02-11 MED ORDER — OXYTOCIN 10 UNIT/ML IJ SOLN
INTRAMUSCULAR | Status: AC
  Filled 2021-02-11: qty 2

## 2021-02-11 MED ORDER — TERBUTALINE SULFATE 1 MG/ML IJ SOLN: 0.2500 mg | Freq: Once | INTRAMUSCULAR | Status: DC | PRN

## 2021-02-11 MED ORDER — MISOPROSTOL 200 MCG PO TABS
ORAL_TABLET | ORAL | Status: AC
  Administered 2021-02-12: 800 ug via RECTAL
  Filled 2021-02-11: qty 4

## 2021-02-11 MED ORDER — LIDOCAINE HCL (PF) 1 % IJ SOLN
INTRAMUSCULAR | Status: AC
  Filled 2021-02-11: qty 30

## 2021-02-11 MED ORDER — SOD CITRATE-CITRIC ACID 500-334 MG/5ML PO SOLN: 30.0000 mL | ORAL | Status: DC | PRN

## 2021-02-11 MED ORDER — ONDANSETRON HCL 4 MG/2ML IJ SOLN
4.0000 mg | Freq: Four times a day (QID) | INTRAMUSCULAR | Status: DC | PRN
Start: 2021-02-11 — End: 2021-02-12

## 2021-02-11 NOTE — Progress Notes (Signed)
Dating: EDD: 03/02/21  by LMP: 05/26/20 and c/w [place] at 7+2 wks.   Preg c/b: 1. Pre-eclampsia without severe features, elevated BP at 37wks, P/C ratio 658 2. Resolved large [facility]  Prenatal Labs: Blood type/Rh O pos  Antibody screen neg  Rubella Immune  Varicella Immune  RPR NR  HBsAg Neg  HIV NR  GC neg  Chlamydia neg  Genetic screening  declined  1 hour GTT 129  3 hour GTT   GBS  Neg    Contraception: non-hormonal, NFP Infant feeding: breast Tdap: 12/11/20 Flu: 06/2020

## 2021-02-11 NOTE — H&P (Signed)
OB History & Physical   History of Present Illness:  Chief Complaint: sent from office for induction of labor  HPI:  Susan Cannon is a 25 y.o. G2P0010 female at [redacted]w[redacted]d dated by LMP 05/26/20 and c/w [place] at 7+2wks.  She presents to L&D for IOL due to Preeclampsia without severe features. Reports active FM, having irreg UCs, denies LOF or VB.  - Denies HA, VD or RUQ pain.     Pregnancy Issues: 1. Pre-eclampsia without severe features, elevated BP at 37wks, P/C ratio 658 2. Resolved large [facility]   Maternal Medical History:   Past Medical History:  Diagnosis Date  . Heart murmur   . History of chicken pox    25 y.o.    Past Surgical History:  Procedure Laterality Date  . DILATION AND EVACUATION N/A 04/21/2020   Procedure: DILATATION AND EVACUATION (SUCTION D&C);  Surgeon: Christeen Douglas, MD;  Location: [facility] ORS;  Service: Gynecology;  Laterality: N/A;  . WISDOM TOOTH EXTRACTION      Allergies  Allergen Reactions  . Sulfa Antibiotics Anaphylaxis    Prior to Admission medications   Medication Sig Start Date End Date Taking? Authorizing Provider  Prenatal Vit-Fe Fumarate-FA (MULTIVITAMIN-PRENATAL) 27-0.8 MG TABS tablet Take 1 tablet by mouth daily at 12 noon.   Yes [provider]  acetaminophen (TYLENOL) 325 MG tablet Take 650 mg by mouth every 6 (six) hours as needed for headache.    [provider]  ibuprofen (ADVIL) 200 MG tablet Take 400 mg by mouth every 6 (six) hours as needed for headache.    [provider]  ibuprofen (ADVIL) 800 MG tablet Take 1 tablet (800 mg total) by mouth every 8 (eight) hours as needed for moderate pain or cramping. 04/21/20   Christeen Douglas, MD  loratadine (CLARITIN) 10 MG tablet Take 10 mg by mouth daily as needed for allergies.    [provider]     Prenatal care site: [facility]   Social History: She  reports that she has never smoked. She has never used smokeless tobacco. She reports that she does  not drink alcohol and does not use drugs.  Family History: family history includes Cancer in her maternal grandmother and paternal grandfather; Colon cancer in her paternal grandfather; Hyperlipidemia in her brother and father; Hypertension in her father and mother.   Review of Systems: A full review of systems was performed and negative except as noted in the HPI.     Physical Exam:  Vital Signs: BP 132/78   Pulse 87   Temp 97.8 F (36.6 C) (Oral)   Resp 17   Ht 5\' 5"  (1.651 m)   Wt 81.8 kg   BMI 30.02 kg/m  General: no acute distress.  HEENT: normocephalic, atraumatic Heart: regular rate & rhythm.  No murmurs/rubs/gallops Lungs: clear to auscultation bilaterally, normal respiratory effort Abdomen: soft, gravid, non-tender;  EFW: 8lbs Pelvic:   External: Normal external female genitalia  Cervix: Dilation: 3.5 / Effacement (%): 90 / Station: -1    Extremities: non-tender, symmetric, No edema bilaterally.  DTRs: 2+  Neurologic: Alert & oriented x 3.    Results for orders placed or performed during the hospital encounter of 02/11/21 (from the past 24 hour(s))  Protein / creatinine ratio, urine     Status: Abnormal   Collection Time: 02/11/21  5:12 PM  Result Value Ref Range   Creatinine, Urine 97 mg/dL   Total Protein, Urine 38 mg/dL   Protein Creatinine Ratio 0.39 (  H) 0.00 - 0.15 mg/mg[Cre]  Resp Panel by RT-PCR (Flu A&B, Covid) Urine, Clean Catch     Status: None   Collection Time: 02/11/21  5:12 PM   Specimen: Urine, Clean Catch; Nasopharyngeal(NP) swabs in vial transport medium  Result Value Ref Range   SARS Coronavirus 2 by RT PCR NEGATIVE NEGATIVE   Influenza A by PCR NEGATIVE NEGATIVE   Influenza B by PCR NEGATIVE NEGATIVE  CBC     Status: Abnormal   Collection Time: 02/11/21  5:32 PM  Result Value Ref Range   WBC 12.2 (H) 4.0 - 10.5 K/uL   RBC 4.05 3.87 - 5.11 MIL/uL   Hemoglobin 13.1 12.0 - 15.0 g/dL   HCT 16.1 09.6 - 04.5 %   MCV 91.1 80.0 - 100.0 fL   MCH  32.3 26.0 - 34.0 pg   MCHC 35.5 30.0 - 36.0 g/dL   RDW 40.9 81.1 - 91.4 %   Platelets 258 150 - 400 K/uL   nRBC 0.0 0.0 - 0.2 %  Type and screen     Status: None   Collection Time: 02/11/21  5:32 PM  Result Value Ref Range   ABO/RH(D) O POS    Antibody Screen NEG    Sample Expiration      02/14/2021,2359 Performed at [facility], [address] [place], [place] [address]     Pertinent Results:  Prenatal Labs: Blood type/Rh O pos  Antibody screen neg  Rubella Immune  Varicella Immune  RPR NR  HBsAg Neg  HIV NR  GC neg  Chlamydia neg  Genetic screening  declined  1 hour GTT 129  3 hour GTT   GBS  Neg    FHT: 140bpm, mod variability, + accels, no decels TOCO: q2-71min SVE:  Dilation: 3.5 / Effacement (%): 90 / Station: -1    Cephalic by leopolds/SVE No results found.  Assessment:  Susan Cannon is a 25 y.o. G2P0010 female at [redacted]w[redacted]d with IOL for Pre-E.   Plan:  1. Admit to Labor & Delivery; consents reviewed and obtained - COVID negative - CMP, CBC and P/C ratio repeat.  - d/w Dr Feliberto Gottron.   2. Fetal Well being  - Fetal Tracing: Cat I tracing - Group B Streptococcus ppx indicated: Negative - Presentation: cephalic confirmed by exam   3. Routine OB: - Prenatal labs reviewed, as above - Rh O Pos - CBC, T&S, RPR on admit - Clear fluids, IVF  4. Induction of Labor -  Contractions: external toco in place -  Pelvis adequate for TOL -  Plan for induction with Pitocin, AROM -  Plan for continuous fetal monitoring  -  Maternal pain control as desired - Anticipate vaginal delivery  5. Post Partum Planning: Contraception: non-hormonal, NFP Infant feeding: breast Tdap: 12/11/20 Flu: 06/2020  Prudencio Pair Danelly Hassinger, CNM 02/11/21 7:31 PM

## 2021-02-12 ENCOUNTER — Encounter: Payer: Self-pay | Admitting: Obstetrics and Gynecology

## 2021-02-12 ENCOUNTER — Inpatient Hospital Stay: Payer: 59 | Admitting: Anesthesiology

## 2021-02-12 LAB — CBC
HCT: 35.8 % — ABNORMAL LOW (ref 36.0–46.0)
Hemoglobin: 12.3 g/dL (ref 12.0–15.0)
MCH: 31.3 pg (ref 26.0–34.0)
MCHC: 34.4 g/dL (ref 30.0–36.0)
MCV: 91.1 fL (ref 80.0–100.0)
Platelets: 256 10*3/uL (ref 150–400)
RBC: 3.93 MIL/uL (ref 3.87–5.11)
RDW: 13 % (ref 11.5–15.5)
WBC: 26 10*3/uL — ABNORMAL HIGH (ref 4.0–10.5)
nRBC: 0 % (ref 0.0–0.2)

## 2021-02-12 LAB — COMPREHENSIVE METABOLIC PANEL
ALT: 13 U/L (ref 0–44)
AST: 26 U/L (ref 15–41)
Albumin: 2.4 g/dL — ABNORMAL LOW (ref 3.5–5.0)
Alkaline Phosphatase: 119 U/L (ref 38–126)
Anion gap: 7 (ref 5–15)
BUN: 10 mg/dL (ref 6–20)
CO2: 20 mmol/L — ABNORMAL LOW (ref 22–32)
Calcium: 8.3 mg/dL — ABNORMAL LOW (ref 8.9–10.3)
Chloride: 108 mmol/L (ref 98–111)
Creatinine, Ser: 0.65 mg/dL (ref 0.44–1.00)
GFR, Estimated: >60 mL/min (ref >60)
Glucose, Bld: 111 mg/dL — ABNORMAL HIGH (ref 70–99)
Potassium: 3.1 mmol/L — ABNORMAL LOW (ref 3.5–5.1)
Sodium: 135 mmol/L (ref 135–145)
Total Bilirubin: 0.4 mg/dL (ref 0.3–1.2)
Total Protein: 5.2 g/dL — ABNORMAL LOW (ref 6.5–8.1)

## 2021-02-12 LAB — RPR: RPR Ser Ql: NONREACTIVE

## 2021-02-12 MED ORDER — PRENATAL MULTIVITAMIN CH
1.0000 | ORAL_TABLET | Freq: Every day | ORAL | Status: DC
  Administered 2021-02-12 – 2021-02-13 (×2): 1 via ORAL
  Filled 2021-02-12 (×2): qty 1

## 2021-02-12 MED ORDER — SIMETHICONE 80 MG PO CHEW: 80.0000 mg | CHEWABLE_TABLET | ORAL | Status: DC | PRN

## 2021-02-12 MED ORDER — DIBUCAINE (PERIANAL) 1 % EX OINT
1.0000 "application " | TOPICAL_OINTMENT | CUTANEOUS | Status: DC | PRN
  Administered 2021-02-12: 1 via RECTAL
  Filled 2021-02-12 (×2): qty 28

## 2021-02-12 MED ORDER — LORATADINE 10 MG PO TABS
10.0000 mg | ORAL_TABLET | Freq: Every day | ORAL | Status: DC | PRN
  Filled 2021-02-12: qty 1

## 2021-02-12 MED ORDER — ACETAMINOPHEN 325 MG PO TABS
650.0000 mg | ORAL_TABLET | ORAL | Status: DC | PRN
  Administered 2021-02-12 (×3): 650 mg via ORAL
  Filled 2021-02-12 (×3): qty 2

## 2021-02-12 MED ORDER — BENZOCAINE-MENTHOL 20-0.5 % EX AERO
1.0000 "application " | INHALATION_SPRAY | CUTANEOUS | Status: DC | PRN
  Administered 2021-02-12: 1 via TOPICAL
  Filled 2021-02-12: qty 56

## 2021-02-12 MED ORDER — WITCH HAZEL-GLYCERIN EX PADS
1.0000 "application " | MEDICATED_PAD | CUTANEOUS | Status: DC | PRN
  Administered 2021-02-12: 1 via TOPICAL
  Filled 2021-02-12: qty 100

## 2021-02-12 MED ORDER — FENTANYL 2.5 MCG/ML W/ROPIVACAINE 0.15% IN NS 100 ML EPIDURAL (ARMC)
EPIDURAL | Status: AC
  Filled 2021-02-12: qty 100

## 2021-02-12 MED ORDER — DIPHENHYDRAMINE HCL 25 MG PO CAPS: 25.0000 mg | ORAL_CAPSULE | Freq: Four times a day (QID) | ORAL | Status: DC | PRN

## 2021-02-12 MED ORDER — ZOLPIDEM TARTRATE 5 MG PO TABS: 5.0000 mg | ORAL_TABLET | Freq: Every evening | ORAL | Status: DC | PRN

## 2021-02-12 MED ORDER — ONDANSETRON HCL 4 MG PO TABS
4.0000 mg | ORAL_TABLET | ORAL | Status: DC | PRN
  Filled 2021-02-12: qty 1

## 2021-02-12 MED ORDER — COCONUT OIL OIL
1.0000 "application " | TOPICAL_OIL | Status: DC | PRN
  Administered 2021-02-12: 1 via TOPICAL
  Filled 2021-02-12: qty 120

## 2021-02-12 MED ORDER — IBUPROFEN 600 MG PO TABS
600.0000 mg | ORAL_TABLET | Freq: Four times a day (QID) | ORAL | Status: DC
Start: 2021-02-12 — End: 2021-02-13
  Administered 2021-02-12 – 2021-02-13 (×5): 600 mg via ORAL
  Filled 2021-02-12 (×6): qty 1

## 2021-02-12 MED ORDER — ONDANSETRON HCL 4 MG/2ML IJ SOLN: 4.0000 mg | INTRAMUSCULAR | Status: DC | PRN

## 2021-02-12 MED ORDER — SENNOSIDES-DOCUSATE SODIUM 8.6-50 MG PO TABS
2.0000 | ORAL_TABLET | Freq: Every day | ORAL | Status: DC
  Administered 2021-02-13: 2 via ORAL
  Filled 2021-02-12: qty 2

## 2021-02-12 NOTE — Progress Notes (Signed)
Labor Progress Note  Susan Cannon is a 25 y.o. G2P0010 at [redacted]w[redacted]d by LMP admitted for induction of labor due to Pre-E.  Subjective: feeling pressure with each UC now. Has been actively moving and position changing. NO HA, VD or RUQ pain.   Objective: BP 131/90 (BP Location: Right Arm)   Pulse (!) 104   Temp 98.1 F (36.7 C) (Oral)   Resp 16   Ht 5\' 5"  (1.651 m)   Wt 81.8 kg   BMI 30.02 kg/m  Notable VS details: mild range BP intermittently    Fetal Assessment: FHT:  FHR: 135 bpm, variability: moderate,  accelerations:  Present,  decelerations:  Present early and variable occasionally.  Category/reactivity:  Category I UC:   regular, every 2-3 minutes, pitocin at 33mu/min SVE:   5/90/-1 with BBOW, AROM performed, large amt clear fluid.  Membrane status: AROM at 0034 Amniotic color: clear  Labs: Lab Results  Component Value Date   WBC 12.2 (H) 02/11/2021   HGB 13.1 02/11/2021   HCT 36.9 02/11/2021   MCV 91.1 02/11/2021   PLT 258 02/11/2021   Component     Latest Ref Rng & Units 02/11/2021  Sodium     135 - 145 mmol/L 137  Potassium     3.5 - 5.1 mmol/L 3.5  Chloride     98 - 111 mmol/L 109  CO2     22 - 32 mmol/L 21 (L)  Glucose     70 - 99 mg/dL 90  BUN     6 - 20 mg/dL 13  Creatinine     02/13/2021 - 1.00 mg/dL 8.65  Calcium     8.9 - 10.3 mg/dL 8.7 (L)  Total Protein     6.5 - 8.1 g/dL 6.3 (L)  Albumin     3.5 - 5.0 g/dL 3.0 (L)  AST     15 - 41 U/L 20  ALT     0 - 44 U/L 14  Alkaline Phosphatase     38 - 126 U/L 131 (H)  Total Bilirubin     0.3 - 1.2 mg/dL 0.4  GFR, Estimated     >60 mL/min >60  Anion gap     5 - 15 7  Creatinine, Urine     mg/dL 97  Total Protein, Urine     mg/dL 38  Protein Creatinine Ratio     0.00 - 0.15 mg/mgCre 0.39 (H)    Assessment / Plan: Spontaneous labor, progressing normally  Labor: some cervical change since last exam, AROM performed, lg amt clear fluid.  Preeclampsia:  intake and ouput balanced and labs  stable Fetal Wellbeing:  Category II- variable and early decels since AROM.  Pain Control:  Labor support without medications I/D:  n/a Anticipated MOD:  NSVD  7.84, CNM 02/12/2021, 12:28 AM

## 2021-02-12 NOTE — Discharge Summary (Signed)
Obstetrical Discharge Summary  Patient Name: Susan Cannon DOB: [birth year] MRN: [id number]  Date of Admission: 02/11/2021 Date of Delivery: 02/12/21 Delivered by: Heloise Ochoa CNM Date of Discharge: 02/13/2021  Primary OB: [facility] LMP:No LMP recorded. EDC Estimated Date of Delivery: 03/02/21 Gestational Age at Delivery: [redacted]w[redacted]d   Antepartum complications:  1. Pre-eclampsia without severe features, elevated BP at 37wks, P/C ratio 658 2. Resolved large [facility]  Admitting Diagnosis: Pre-Eclampsia without severe features Secondary Diagnosis: SVD  Patient Active Problem List   Diagnosis Date Noted  . Pre-eclampsia during pregnancy in third trimester, antepartum 02/11/2021    Augmentation: AROM and Pitocin Complications: None Intrapartum complications/course: see delivery note Date of Delivery: 02/12/21 Delivered By: Heloise Ochoa CNM Delivery Type: spontaneous vaginal delivery Anesthesia: local for repair Placenta: spontaneous Laceration: 1st deg, bilateral urethral Episiotomy: none Newborn Data: Live born female "Earl Lites"  Birth Weight: 6lbs 3.1oz, 2810g  APGAR: 8, 9  Newborn Delivery   Birth date/time: 02/12/2021 03:37:00 Delivery type: Vaginal, Spontaneous     Postpartum Procedures: None   Edinburgh:  Edinburgh Postnatal Depression Scale Screening Tool 02/12/2021  I have been able to laugh and see the funny side of things. 0  I have looked forward with enjoyment to things. 0  I have blamed myself unnecessarily when things went wrong. 0  I have been anxious or worried for no good reason. 1  I have felt scared or panicky for no good reason. 0  Things have been getting on top of me. 0  I have been so unhappy that I have had difficulty sleeping. 0  I have felt sad or miserable. 0  I have been so unhappy that I have been crying. 0  The thought of harming myself has occurred to me. 0  Edinburgh Postnatal Depression Scale Total 1      Post partum course:   Patient had an uncomplicated postpartum course.  By time of discharge on PPD#1, her pain was controlled on oral pain medications; she had appropriate lochia and was ambulating, voiding without difficulty and tolerating regular diet.  Blood pressures were all normal, no mild or severe range blood pressures.  She was deemed stable for discharge to home.   [name]    Discharge Physical Exam:  BP 111/84 (BP Location: Right Arm)   Pulse 86   Temp 98.4 F (36.9 C) (Oral)   Resp 20   Ht 5\' 5"  (1.651 m)   Wt 81.8 kg   SpO2 99%   Breastfeeding Unknown   BMI 30.02 kg/m   General: NAD CV: RRR Pulm: CTABL, nl effort ABD: s/nd/nt, fundus firm and below the umbilicus Lochia: moderate Perineum: well approximated/intact DVT Evaluation: LE non-ttp, no evidence of DVT on exam.  Hemoglobin  Date Value Ref Range Status  02/13/2021 12.3 12.0 - 15.0 g/dL Final   HCT  Date Value Ref Range Status  02/13/2021 36.0 36.0 - 46.0 % Final     Disposition: stable, discharge to home. Baby Feeding: breastmilk Baby Disposition: home with mom  Rh Immune globulin given: n/a Rubella vaccine given: immune Varicella vaccine given: immune Tdap vaccine given in AP or PP setting: 12/11/20 Flu vaccine given in AP or PP setting: 06/2020  Contraception: NFP  Prenatal Labs:  Blood type/Rh O pos  Antibody screen neg  Rubella Immune  Varicella Immune  RPR NR  HBsAg Neg  HIV NR  GC neg  Chlamydia neg  Genetic screening declined  1 hour GTT 129  3 hour GTT  GBS Neg       Plan:  Valine Drozdowski was discharged to home in good condition. Follow-up appointment for blood pressure check in 3-5 days and with delivering provider in 6 weeks.  Discharge Medications: Allergies as of 02/13/2021      Reactions   Sulfa Antibiotics Anaphylaxis      Medication List    TAKE these medications   acetaminophen 325 MG tablet Commonly known as: TYLENOL Take 650 mg by mouth every 6 (six) hours as needed for  headache.   ibuprofen 600 MG tablet Commonly known as: ADVIL Take 1 tablet (600 mg total) by mouth every 6 (six) hours as needed for mild pain or moderate pain. What changed:   medication strength  how much to take  reasons to take this  Another medication with the same name was removed. Continue taking this medication, and follow the directions you see here.   loratadine 10 MG tablet Commonly known as: CLARITIN Take 10 mg by mouth daily as needed for allergies.   multivitamin-prenatal 27-0.8 MG Tabs tablet Take 1 tablet by mouth daily at 12 noon.        Follow-up Information    McVey, Prudencio Pair, CNM. Schedule an appointment as soon as possible for a visit in 6 week(s).   Specialty: Obstetrics and Gynecology Why: postpartum visit Contact information: [address] [place] [address] [phone]        [facility]. Schedule an appointment as soon as possible for a visit.   Why: in 3-5 days (Monday through Wednesday) for blood pressure check  Contact information: [address] [place] [address] [phone]              Signed:  Al Corpus 02/13/2021  1:11 PM   Margaretmary Eddy, CNM Certified Nurse Midwife [facility]

## 2021-02-12 NOTE — Progress Notes (Signed)
Labor Progress Note  Susan Cannon is a 25 y.o. G2P0010 at [redacted]w[redacted]d by LMP admitted for induction of labor due to Pre-E.  Subjective: pressure, urge to push. Checked by RN  Objective: BP 131/90 (BP Location: Right Arm)   Pulse (!) 104   Temp 97.8 F (36.6 C) (Oral)   Resp 16   Ht 5\' 5"  (1.651 m)   Wt 81.8 kg   BMI 30.02 kg/m  Notable VS details: mild range BP intermittently  Fetal Assessment: FHT:  FHR: 135 bpm, variability: moderate,  accelerations:  Present,  decelerations:  Present early and variable occasionally.  Category/reactivity:  Category I UC:   regular, every 2-3 minutes, pitocin at 7 mu/min SVE:   C/C/+2 at 0246 Membrane status: AROM at 0034 Amniotic color: clear   Assessment / Plan: Spontaneous labor, progressing normally  Labor: now 2nd stage,pushing.  Preeclampsia:  intake and ouput balanced and labs stable Fetal Wellbeing:  Category II- variable and early decels since AROM.  Pain Control:  Labor support without medications I/D:  n/a Anticipated MOD:  NSVD  , CNM 02/12/2021, 2:57 AM

## 2021-02-12 NOTE — Lactation Note (Signed)
This note was copied from a baby's chart. Lactation Consultation Note  Patient Name: Susan Cannon HMCNO'B Date: 02/12/2021 Reason for consult: Initial assessment;Primapara;1st time breastfeeding;Early term 37-38.6wks Age:25 hours  Initial lactation visit. Mom is G2P1 SVD 8 hours ago. A few attempted feedings documented, nothing of significance.   LC at beside, attempted with latch- nipples are everted but short and seem to flatten with compression and sandwiching. Nipple shield, size 34mm applied. Baby accepted shield easily and immediately grasped with strong rhythmic sucking pattern. Audible swallows heard, a few times of discomforted noted by mom that improved with pulling down of chin.  LC reviewed positioning and alignment of infant, keeping infant deep on breast tissue, breast massage/compression to help aid in colostrum transfer, and availability of coconut oil for transient nipple pain and tenderness.  LC pointed out to parents when swallows were identified, normal newborn stomach size and feeding patterns, early cues, feeding on demand, and output expectations in first 24 hours. Encouraged 8 or more feeding attempts in first 24 hours, hand expression if baby is sleepy or uninterested in feeding.  Whiteboard updated with [facility] name and phone number. Encouraged to call with questions or for ongoing BF support as needed.  Maternal Data Has patient been taught Hand Expression?: Yes Does the patient have breastfeeding experience prior to this delivery?: No  Feeding Mother's Current Feeding Choice: Breast Milk  LATCH Score Latch: Grasps breast easily, tongue down, lips flanged, rhythmical sucking.  Audible Swallowing: A few with stimulation  Type of Nipple: Flat  Comfort (Breast/Nipple): Soft / non-tender  Hold (Positioning): Assistance needed to correctly position infant at breast and maintain latch.  LATCH Score: 7   Lactation Tools Discussed/Used Tools: Nipple  Shields Nipple shield size: 20 (short/'flattens w/ compression)  Interventions Interventions: Breast feeding basics reviewed;Assisted with latch;Hand express;Adjust position;Support pillows;Education (nipple shield)  Discharge    Consult Status Consult Status: Follow-up Date: 02/12/21 Follow-up type: In-patient    Danford Bad 02/12/2021, 12:12 PM

## 2021-02-13 LAB — CBC WITH DIFFERENTIAL/PLATELET
Abs Immature Granulocytes: 0.25 10*3/uL — ABNORMAL HIGH (ref 0.00–0.07)
Basophils Absolute: 0.1 10*3/uL (ref 0.0–0.1)
Basophils Relative: 0 %
Eosinophils Absolute: 0.3 10*3/uL (ref 0.0–0.5)
Eosinophils Relative: 2 %
HCT: 36 % (ref 36.0–46.0)
Hemoglobin: 12.3 g/dL (ref 12.0–15.0)
Immature Granulocytes: 1 %
Lymphocytes Relative: 16 %
Lymphs Abs: 3 10*3/uL (ref 0.7–4.0)
MCH: 31.6 pg (ref 26.0–34.0)
MCHC: 34.2 g/dL (ref 30.0–36.0)
MCV: 92.5 fL (ref 80.0–100.0)
Monocytes Absolute: 0.9 10*3/uL (ref 0.1–1.0)
Monocytes Relative: 5 %
Neutro Abs: 14 10*3/uL — ABNORMAL HIGH (ref 1.7–7.7)
Neutrophils Relative %: 76 %
Platelets: 229 10*3/uL (ref 150–400)
RBC: 3.89 MIL/uL (ref 3.87–5.11)
RDW: 13.3 % (ref 11.5–15.5)
WBC: 18.5 10*3/uL — ABNORMAL HIGH (ref 4.0–10.5)
nRBC: 0 % (ref 0.0–0.2)

## 2021-02-13 LAB — SURGICAL PATHOLOGY

## 2021-02-13 MED ORDER — IBUPROFEN 600 MG PO TABS: 600.0000 mg | ORAL_TABLET | Freq: Four times a day (QID) | ORAL | 1 refills | Status: DC | PRN

## 2021-02-13 NOTE — Progress Notes (Signed)
Patient discharged home with infant. Discharge instructions and prescriptions given and reviewed with patient. Patient verbalized understanding. Will be escorted out by auxillary.  

## 2021-02-13 NOTE — Lactation Note (Signed)
This note was copied from a baby's chart. Lactation Consultation Note  Patient Name: Susan Cannon NIDPO'E Date: 02/13/2021 Reason for consult: Follow-up assessment;Primapara;Early term 37-38.6wks Age:25 hours  Maternal Data Has patient been taught Hand Expression?: Yes Does the patient have breastfeeding experience prior to this delivery?: No  Feeding Mother's Current Feeding Choice: Breast Milk Mom assisted with latching baby to breast after bath, mom placed in right side lying position, baby rooting, and placed beside mom, breast tissue firm and areola sl tight but baby able to latch easily and is nursing with audible swallows, latched easily to left breast also and audible swallows noted here also, baby is sl jaundiced and mom encouraged to offer breast every 2 hrs to increase stooling get rid of meconium and hydrate baby to prevent increase of bili, has ped appt tomorrow.     LATCH Score Latch: Grasps breast easily, tongue down, lips flanged, rhythmical sucking.  Audible Swallowing: Spontaneous and intermittent  Type of Nipple: Everted at rest and after stimulation  Comfort (Breast/Nipple): Filling, red/small blisters or bruises, mild/mod discomfort  Hold (Positioning): Assistance needed to correctly position infant at breast and maintain latch.  LATCH Score: 8   Lactation Tools Discussed/Used  LC name and no written on white board  Interventions Interventions: Breast feeding basics reviewed;Assisted with latch;Skin to skin;Hand express;Breast massage;Adjust position;Position options;Education  Discharge Discharge Education: Engorgement and breast care;Warning signs for feeding baby Pump: Personal WIC Program: No  Consult Status Consult Status: PRN Date: 02/13/21 Follow-up type: In-patient    Susan Cannon 02/13/2021, 3:14 PM

## 2021-02-13 NOTE — Discharge Instructions (Signed)

## 2021-02-15 ENCOUNTER — Ambulatory Visit: Payer: Self-pay

## 2021-02-15 NOTE — Lactation Note (Addendum)
This note was copied from a baby's chart. Lactation Consultation Note  Patient Name: Susan Cannon [id number]'G Date: 02/15/2021 Reason for consult: Mother's request;Initial assessment;Difficult latch;Primapara;1st time breastfeeding;Early term 37-38.6wks;Hyperbilirubinemia;Infant weight loss Age:25 days   Mom exclusively breastfeeding over 1 hr at times at the breast. Mom just fed for 30 minutes before arrival, stating infant doing much better transferring from the breasts with shorter feeding times. LC not able to see a latch as infant was sleeping. LC attempted a latch, nipples short shafted. Mom to pump using DEBP to extend her nipples.   Mom's breast compression stripe on the left and some areas of dependent edema under the breasts toward axilla. Mom to use EBM for wound healing. In addition, warm compress provided to massage breast and use of pump to empty breast more. Breast softened after heat, massage and manual pumping.   Mom will be supplementing after every feeding at the breast, based on LPTI guidelines and hrs of age since delivery ( 30-60 ml). Yellow slow flow nipple provided and paced bottle feeding reviewed with infant taking 15 ml of EBM.   Urine since admission 6 and stool x 1 Bilirubin on the decline and currently in the low intermediate risk level.   With current pumping session, Mom had pumped 27 ml. Infant took additional 13 ml of EBM for at total of 28 ml.   Plan 1. To feed based on cues 8-12x in 24 hr period, Mom to offer both breasts, with breast compression and look for change in swallows.            2. Dad to supplement with EBM with yellow slow flow nipple as tolerated based on LPTI guidelines reviewed.           3. Mom to pump on maintenance q 3 hrs for 20 minutes.   Mom has Spectra pump at home.  Mom had follow up with LC on Monday following Peds appointment.     Maternal Data Has patient been taught Hand Expression?: Yes Does the patient have breastfeeding  experience prior to this delivery?: No  Feeding Mother's Current Feeding Choice: Breast Milk  LATCH Score                    Lactation Tools Discussed/Used Tools: Shells;Pump;Flanges;Coconut oil Flange Size: 21 Pump Education: Setup, frequency, and cleaning;Milk Storage Reason for Pumping: increase stimulation Pumping frequency: every 3 hrs for 15 minutes  Interventions Interventions: Breast feeding basics reviewed;Adjust position;DEBP;Support pillows;Skin to skin;Position options;Education;Breast massage;Expressed milk;Hand express;Coconut oil;Shells;Pre-pump if needed;Breast compression  Discharge Discharge Education: Engorgement and breast care;Warning signs for feeding baby;Outpatient recommendation Pump: Personal WIC Program: No  Consult Status Consult Status: Follow-up Date: 02/16/21 Follow-up type: In-patient    Susan Cannon  Susan Cannon 02/15/2021, 1:07 PM

## 2021-02-16 DIAGNOSIS — O1405 Mild to moderate pre-eclampsia, complicating the puerperium: Secondary | ICD-10-CM | POA: Diagnosis not present

## 2021-02-25 DIAGNOSIS — Z8759 Personal history of other complications of pregnancy, childbirth and the puerperium: Secondary | ICD-10-CM | POA: Diagnosis not present

## 2021-03-10 DIAGNOSIS — B372 Candidiasis of skin and nail: Secondary | ICD-10-CM | POA: Diagnosis not present

## 2021-03-26 DIAGNOSIS — Z1332 Encounter for screening for maternal depression: Secondary | ICD-10-CM | POA: Diagnosis not present

## 2021-07-01 DIAGNOSIS — Z0000 Encounter for general adult medical examination without abnormal findings: Secondary | ICD-10-CM | POA: Diagnosis not present

## 2021-08-25 DIAGNOSIS — H52223 Regular astigmatism, bilateral: Secondary | ICD-10-CM | POA: Diagnosis not present

## 2021-08-25 DIAGNOSIS — Z135 Encounter for screening for eye and ear disorders: Secondary | ICD-10-CM | POA: Diagnosis not present

## 2022-03-14 IMAGING — US US MFM OB COMPLETE +14 WKS
1 series · 15 of 28 positions shown · non-contrast
Comparison: none

[Series 1: us mfm ob complete +14 wks · 15 of 31 slices shown]
[im 1/31]
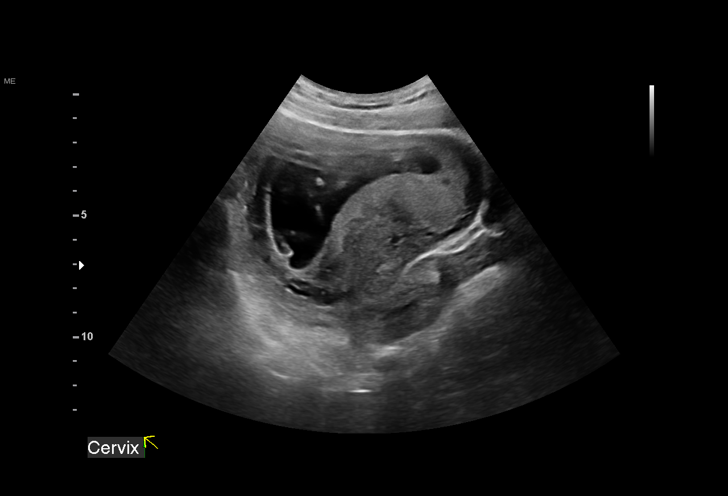
[im 3/31]
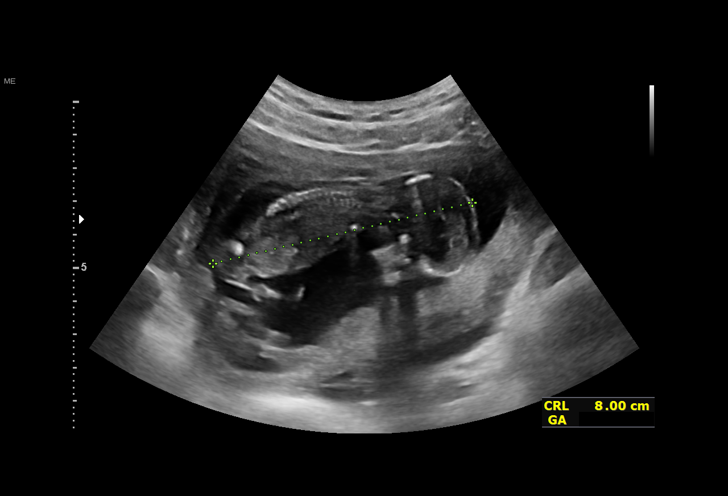
[im 5/31]
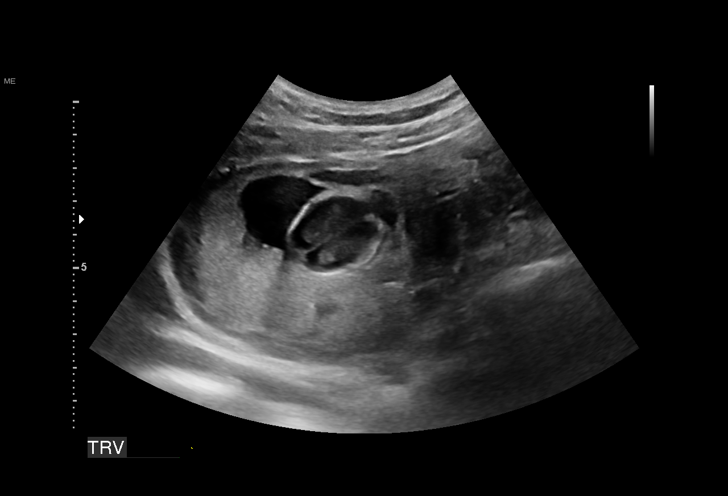
[im 7/31]
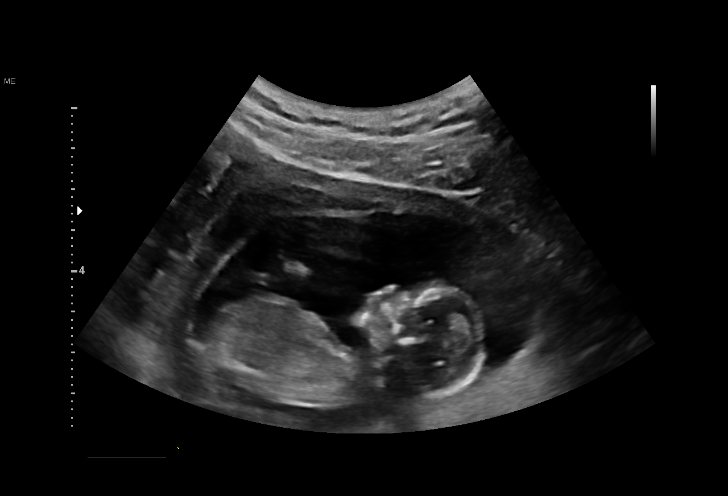
[im 9/31]
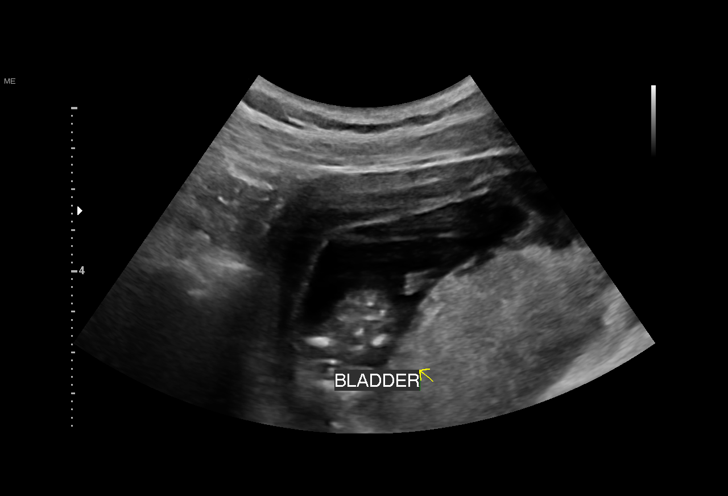
[im 12/31]
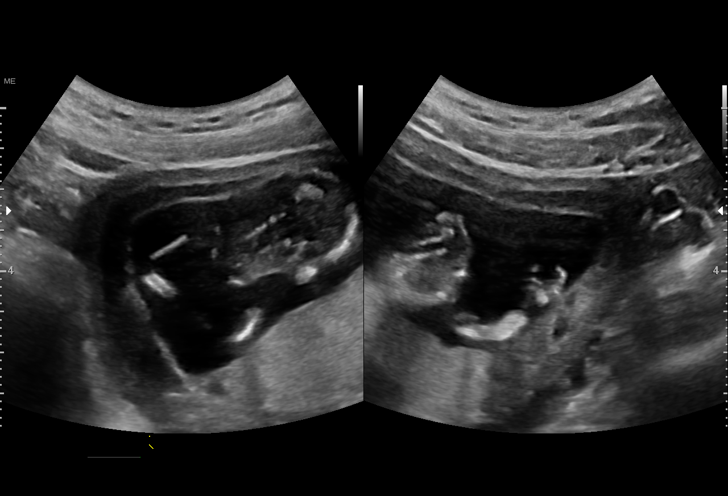
[im 14/31]
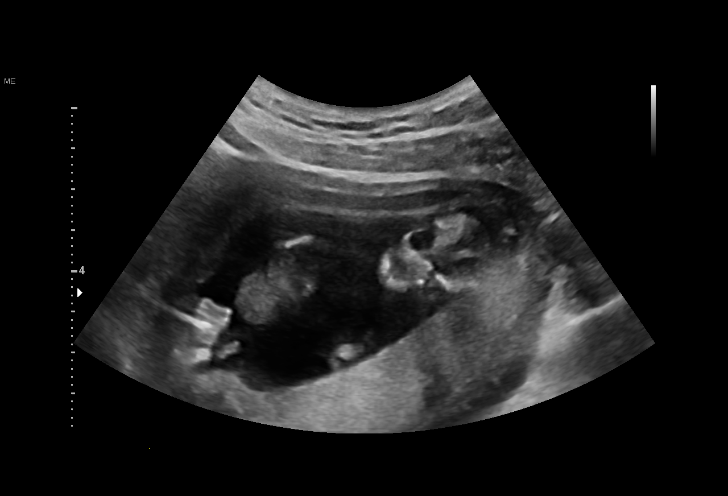
[im 16/31]
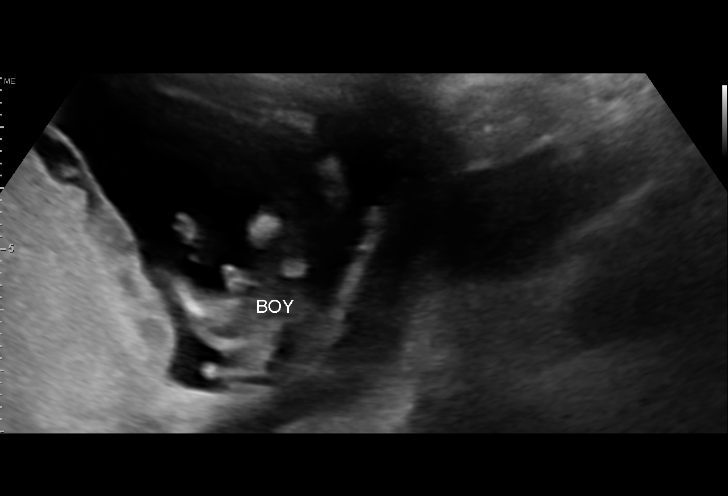
[im 17/31]
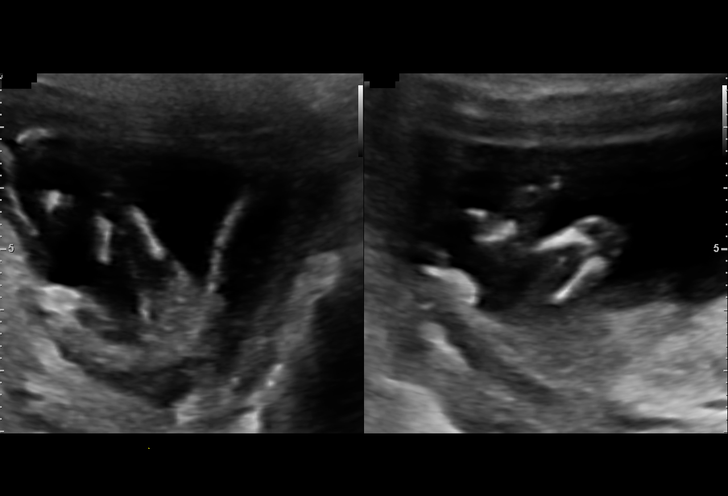
[im 19/31]
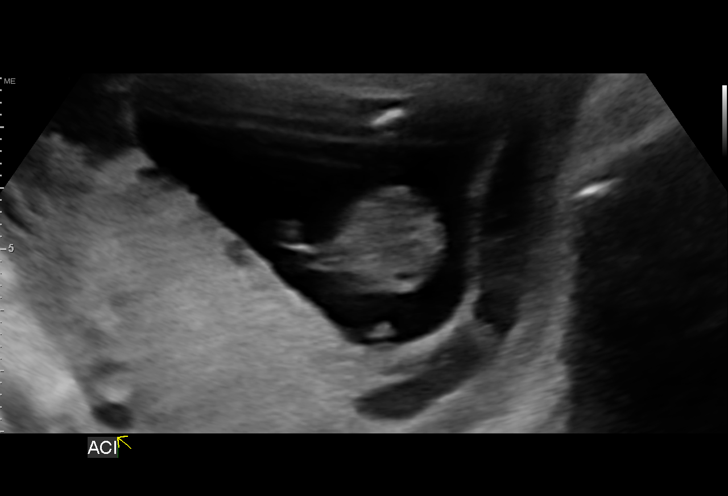
[im 22/31]
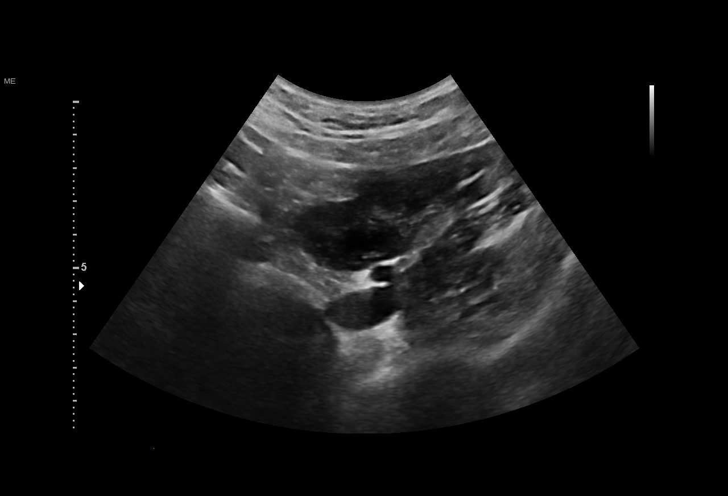
[im 24/31]
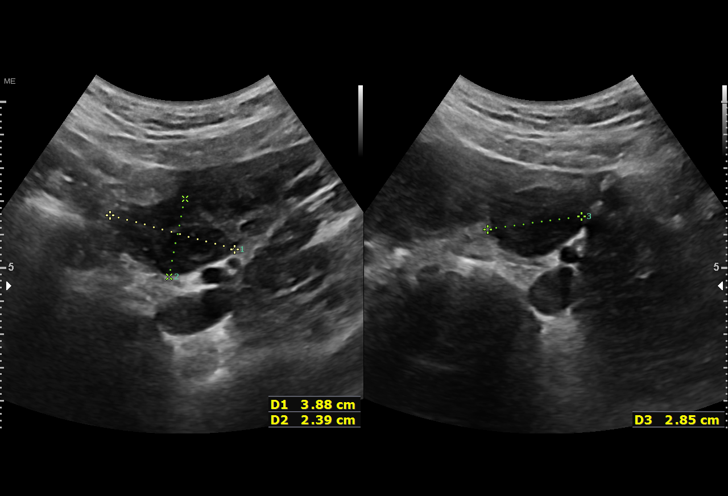
[im 26/31]
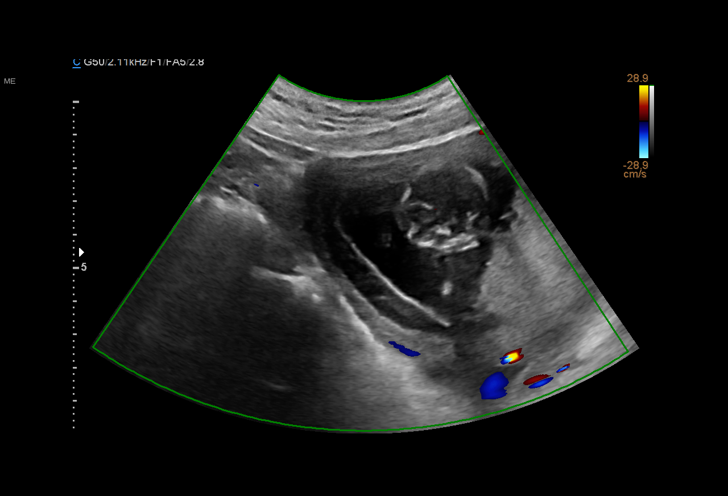
[im 28/31]
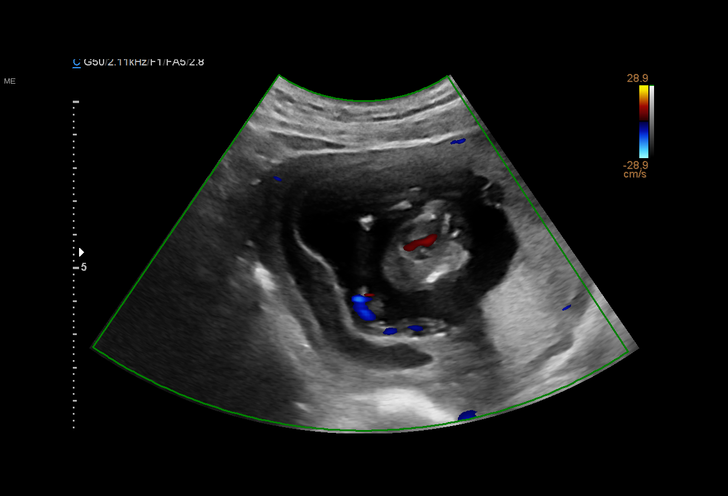
[im 31/31]
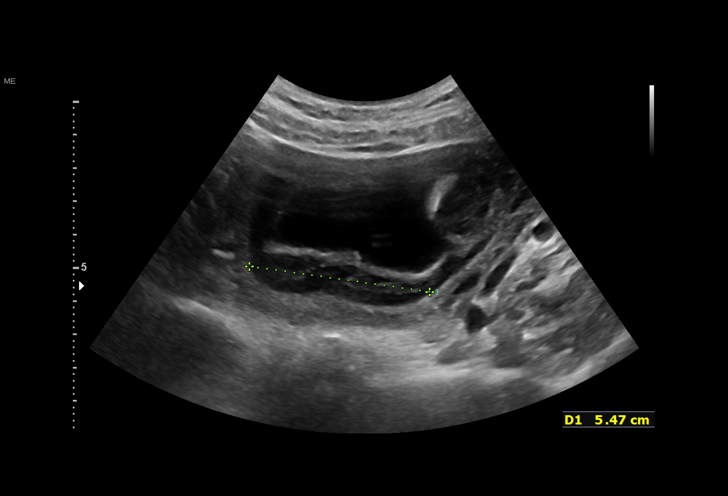

[15 of 28 positions shown; findings below may reference images not displayed]

BENRABAH

 1  US MFM OB COMP LESS THAN              76801.4     INTERHOSTAS TOLVAISIENE
    14 WEEKS

Indications

 13 weeks gestation of pregnancy
 Subchorionic hemorrhage, antepartum
Fetal Evaluation

 Num Of Fetuses:         1
 Fetal Heart Rate(bpm):  160
 Cardiac Activity:       Observed
 Placenta:               Posterior
 P. Cord Insertion:      Visualized, central
Biometry

 CRL:        80  mm     G. Age:  13w 5d                  EDD:   02/28/21
Gestational Age

 LMP:           13w 3d        Date:  05/26/20                 EDD:   03/02/21
 Best:          13w 3d     Det. By:  LMP  (05/26/20)          EDD:   03/02/21
Cervix Uterus Adnexa

 Cervix
 Length:           3.72  cm.

 Comment
 Subchorionic hemorrhage 7 cm x 1 cm
Impression

 Single intrauterine pregnancy with measurement consistent
 with date
 There was normal amniotic fluid and fetal movement.
 There is an anterior placenta previa observed, however, the
 clinical significance at this gestational age is undetermined.

 Lastly, we did observe a posterior subamniotic hemorrhage
 that was 7 cm x 1 cm ( elongaged) it appears stable.

 I discussed with Ms. Gab Tiger visit including bleeding
 precautions.

 She declined all genetic testing.

 I recommend a detailed anatomy at 18-20 weeks, if the
 placenta is still a previa counseling will be performed at that
 time.
Recommendations

 Detailed anatomy at 18-20 weeks.

## 2022-07-23 ENCOUNTER — Ambulatory Visit (INDEPENDENT_AMBULATORY_CARE_PROVIDER_SITE_OTHER): Payer: No Typology Code available for payment source | Admitting: Obstetrics

## 2022-07-23 ENCOUNTER — Encounter: Payer: Self-pay | Admitting: Obstetrics

## 2022-07-23 VITALS — BP 128/75 | HR 95 | Resp 15 | Wt 143.1 lb

## 2022-07-23 DIAGNOSIS — Z3201 Encounter for pregnancy test, result positive: Secondary | ICD-10-CM | POA: Diagnosis not present

## 2022-07-23 DIAGNOSIS — N912 Amenorrhea, unspecified: Secondary | ICD-10-CM

## 2022-07-23 DIAGNOSIS — N926 Irregular menstruation, unspecified: Secondary | ICD-10-CM | POA: Insufficient documentation

## 2022-07-23 LAB — POCT URINE PREGNANCY: Preg Test, Ur: POSITIVE — AB

## 2022-07-23 NOTE — Progress Notes (Signed)
Obstetrics & Gynecology Office Visit   Chief Complaint:  Chief Complaint  Patient presents with   Absence of Menses    History of Present Illness: Susan Cannon presents with her husband for confirmation of pregnancy. She reports a known LMP of 05/30/2022. This is a desired and planned pregnancy. She has a son at home , who is a toddler. Her previous pregnancy was induced secondary to pre -eclampsia at 37 weeks. Today she reports sxs of nausea, fatigue.   Review of Systems:  Review of Systems  Constitutional:  Positive for malaise/fatigue.  Gastrointestinal:  Positive for nausea and vomiting.  All other systems reviewed and are negative.    Past Medical History:  Past Medical History:  Diagnosis Date   Heart murmur    History of chicken pox    26 y.o.    Past Surgical History:  Past Surgical History:  Procedure Laterality Date   DILATION AND EVACUATION N/A 04/21/2020   Procedure: DILATATION AND EVACUATION (SUCTION D&C);  Surgeon: Christeen Douglas, MD;  Location: [facility] ORS;  Service: Gynecology;  Laterality: N/A;   WISDOM TOOTH EXTRACTION      Gynecologic History: Patient's last menstrual period was 05/30/2022 (exact date).  Obstetric History: G3P1011  Family History:  Family History  Problem Relation Age of Onset   Hypertension Mother    Hyperlipidemia Father    Hypertension Father    Colon cancer Paternal Grandfather    Cancer Paternal Grandfather    Hyperlipidemia Brother    Cancer Maternal Grandmother     Social History:  Social History   Socioeconomic History   Marital status: Married    Spouse name: Reuel Boom   Number of children: Not on file   Years of education: Not on file   Highest education level: Not on file  Occupational History   Occupation: CNA  Tobacco Use   Smoking status: Never   Smokeless tobacco: Never  Vaping Use   Vaping Use: Never used  Substance and Sexual Activity   Alcohol use: No   Drug use: No   Sexual activity: Yes  Other Topics  Concern   Not on file  Social History Narrative   Not on file   Social Determinants of Health   Financial Resource Strain: Not on file  Food Insecurity: Not on file  Transportation Needs: Not on file  Physical Activity: Not on file  Stress: Not on file  Social Connections: Not on file  Intimate Partner Violence: Not on file    Allergies:  Allergies  Allergen Reactions   Sulfa Antibiotics Anaphylaxis    Medications: Prior to Admission medications   Medication Sig Start Date End Date Taking? Authorizing Provider  Prenatal Vit-Fe Fumarate-FA (MULTIVITAMIN-PRENATAL) 27-0.8 MG TABS tablet Take 1 tablet by mouth daily at 12 noon.   Yes [provider]    Physical Exam Vitals:  Vitals:   07/23/22 1424  BP: 128/75  Pulse: 95  Resp: 15   Patient's last menstrual period was 05/30/2022 (exact date).     Assessment: 26 y.o. G3P1011  for confirmation of pregnancy GA is likely 7 weeks 5 days today per her LMP   Plan: Problem List Items Addressed This Visit   None Visit Diagnoses     Absence of menstruation    -  Primary   Relevant Orders   POCT urine pregnancy (Completed)   US OB Comp Less 14 Wks   Positive urine pregnancy test       Relevant Orders  US OB Comp Less 14 Wks     She and her husband are welcomed to the practice. WE will set her up for the OB interview and labwork. The MD/CNM model of care is described to her. I have also ordered a  dating scan for her. All questions have been answered.  Imagene Riches, CNM  07/23/2022 6:43 PM

## 2022-08-02 ENCOUNTER — Ambulatory Visit: Payer: No Typology Code available for payment source

## 2022-08-03 ENCOUNTER — Ambulatory Visit
Admission: RE | Admit: 2022-08-03 | Discharge: 2022-08-03 | Disposition: A | Discharge status: Home / Self Care | Payer: No Typology Code available for payment source | Source: Ambulatory Visit | Attending: Obstetrics | Admitting: Obstetrics

## 2022-08-03 DIAGNOSIS — N912 Amenorrhea, unspecified: Secondary | ICD-10-CM | POA: Diagnosis present

## 2022-08-03 DIAGNOSIS — Z3201 Encounter for pregnancy test, result positive: Secondary | ICD-10-CM | POA: Diagnosis present

## 2022-08-06 ENCOUNTER — Ambulatory Visit (INDEPENDENT_AMBULATORY_CARE_PROVIDER_SITE_OTHER): Payer: No Typology Code available for payment source

## 2022-08-06 VITALS — Wt 143.0 lb

## 2022-08-06 DIAGNOSIS — O0990 Supervision of high risk pregnancy, unspecified, unspecified trimester: Secondary | ICD-10-CM | POA: Insufficient documentation

## 2022-08-06 DIAGNOSIS — Z3689 Encounter for other specified antenatal screening: Secondary | ICD-10-CM

## 2022-08-06 DIAGNOSIS — Z369 Encounter for antenatal screening, unspecified: Secondary | ICD-10-CM

## 2022-08-06 DIAGNOSIS — Z3480 Encounter for supervision of other normal pregnancy, unspecified trimester: Secondary | ICD-10-CM

## 2022-08-06 NOTE — Progress Notes (Signed)
New OB Intake  I connected with  Susan Cannon on 08/06/22 at  2:15 PM EST by telephone and verified that I am speaking with the correct person using two identifiers. Nurse is located at [facility] and pt is located at home.  I explained I am completing New OB Intake today. We discussed her EDD of 03/11/2023 that is based on u/s of 08/03/2022. Pt is G3/P1011. I reviewed her allergies, medications, Medical/Surgical/OB history, and appropriate screenings. Based on history, this is a/an pregnancy uncomplicated .   Patient Active Problem List   Diagnosis Date Noted   Missed period 07/23/2022   Pre-eclampsia during pregnancy in third trimester, antepartum 02/11/2021    Concerns addressed today None  Delivery Plans:  Plans to deliver at [facility].  Anatomy US Explained first scheduled [place] will be around 20 weeks.  Labs Discussed genetic screening with patient. Patient desires genetic testing if her ins will pay for it; adv to check with her ins co.  Discussed possible labs to be drawn at new OB appointment.  COVID Vaccine Patient has not had COVID vaccine.   Social Determinants of Health Food Insecurity: denies food insecurity.  Transportation: Patient denies transportation needs. Childcare: Discussed no children allowed at ultrasound appointments.   First visit review I reviewed new OB appt with pt. I explained she will have ob bloodwork and pap smear/pelvic exam if indicated. Explained pt will be seen by Guadlupe Spanish, CNM at first visit; encounter routed to appropriate provider.   Loran Senters, [facility] 08/06/2022  2:32 PM

## 2022-08-09 ENCOUNTER — Other Ambulatory Visit: Payer: No Typology Code available for payment source

## 2022-08-09 DIAGNOSIS — Z369 Encounter for antenatal screening, unspecified: Secondary | ICD-10-CM

## 2022-08-09 DIAGNOSIS — Z3480 Encounter for supervision of other normal pregnancy, unspecified trimester: Secondary | ICD-10-CM

## 2022-08-10 LAB — CBC/D/PLT+RPR+RH+ABO+RUBIGG...
Antibody Screen: NEGATIVE
Basophils Absolute: 0 10*3/uL (ref 0.0–0.2)
Basos: 0 %
EOS (ABSOLUTE): 0.2 10*3/uL (ref 0.0–0.4)
Eos: 2 %
HCV Ab: NONREACTIVE
HIV Screen 4th Generation wRfx: NONREACTIVE
Hematocrit: 39.2 % (ref 34.0–46.6)
Hemoglobin: 13.3 g/dL (ref 11.1–15.9)
Hepatitis B Surface Ag: NEGATIVE
Immature Grans (Abs): 0 10*3/uL (ref 0.0–0.1)
Immature Granulocytes: 0 %
Lymphocytes Absolute: 1.7 10*3/uL (ref 0.7–3.1)
Lymphs: 15 %
MCH: 31 pg (ref 26.6–33.0)
MCHC: 33.9 g/dL (ref 31.5–35.7)
MCV: 91 fL (ref 79–97)
Monocytes Absolute: 0.5 10*3/uL (ref 0.1–0.9)
Monocytes: 4 %
Neutrophils Absolute: 8.7 10*3/uL — ABNORMAL HIGH (ref 1.4–7.0)
Neutrophils: 79 %
Platelets: 292 10*3/uL (ref 150–450)
RBC: 4.29 x10E6/uL (ref 3.77–5.28)
RDW: 12.1 % (ref 11.7–15.4)
RPR Ser Ql: NONREACTIVE
Rh Factor: POSITIVE
Rubella Antibodies, IGG: 5.51 index (ref >0.99)
Varicella zoster IgG: 814 index (ref >165)
WBC: 11.1 10*3/uL — ABNORMAL HIGH (ref 3.4–10.8)

## 2022-08-10 LAB — HCV INTERPRETATION

## 2022-08-16 ENCOUNTER — Ambulatory Visit (INDEPENDENT_AMBULATORY_CARE_PROVIDER_SITE_OTHER): Payer: No Typology Code available for payment source | Admitting: Obstetrics

## 2022-08-16 ENCOUNTER — Other Ambulatory Visit (HOSPITAL_COMMUNITY)
Admission: RE | Admit: 2022-08-16 | Discharge: 2022-08-16 | Disposition: A | Discharge status: Home / Self Care | Payer: No Typology Code available for payment source | Source: Ambulatory Visit | Attending: Obstetrics | Admitting: Obstetrics

## 2022-08-16 VITALS — BP 100/68 | HR 116 | Wt 141.0 lb

## 2022-08-16 DIAGNOSIS — Z3A10 10 weeks gestation of pregnancy: Secondary | ICD-10-CM

## 2022-08-16 DIAGNOSIS — Z124 Encounter for screening for malignant neoplasm of cervix: Secondary | ICD-10-CM | POA: Insufficient documentation

## 2022-08-16 DIAGNOSIS — O219 Vomiting of pregnancy, unspecified: Secondary | ICD-10-CM

## 2022-08-16 DIAGNOSIS — O09291 Supervision of pregnancy with other poor reproductive or obstetric history, first trimester: Secondary | ICD-10-CM | POA: Diagnosis present

## 2022-08-16 DIAGNOSIS — Z3481 Encounter for supervision of other normal pregnancy, first trimester: Secondary | ICD-10-CM

## 2022-08-16 MED ORDER — ASPIRIN 81 MG PO TBEC: 81.0000 mg | DELAYED_RELEASE_TABLET | Freq: Every day | ORAL | 12 refills | Status: DC

## 2022-08-16 NOTE — Progress Notes (Signed)
NEW OB HISTORY AND PHYSICAL  SUBJECTIVE:       Susan Cannon is a 26 y.o. G27P1011 female, Patient's last menstrual period was 05/30/2022 (exact date)., Estimated Date of Delivery: 03/11/23, [redacted]w[redacted]d, presents today for establishment of Prenatal Care. She reports headache, N/V, and fatigue. She reports that the nausea is improving and declines medication at this time. Azalya has a h/o preeclampsia without severe features in her first pregnancy.  Social history Partner/Relationship: FOB involved Living situation: lives with husband and son, feels safe Work: Psychologist, sport and exercise Exercise: work, walking Substance use: denies EtOH, tobacco, vape, and recreational drugs   Gynecologic History Patient's last menstrual period was 05/30/2022 (exact date). Normal Contraception: none Last Pap: 02/21/18 Results were: normal   Obstetric History OB History  Gravida Para Term Preterm AB Living  3 1 1   1 1   SAB IAB Ectopic Multiple Live Births  1     0 1    # Outcome Date GA Lbr Len/2nd Weight Sex Delivery Anes PTL Lv  3 Current           2 Term 02/12/21 [redacted]w[redacted]d / 00:51 6 lb 3.1 oz (2.81 kg) M Vag-Spont Local  LIV  1 SAB 03/27/20            Past Medical History:  Diagnosis Date   Heart murmur    History of chicken pox    26 y.o.    Past Surgical History:  Procedure Laterality Date   DILATION AND EVACUATION N/A 04/21/2020   Procedure: DILATATION AND EVACUATION (SUCTION D&C);  Surgeon: 04/23/2020, MD;  Location: [facility] ORS;  Service: Gynecology;  Laterality: N/A;   WISDOM TOOTH EXTRACTION      Current Outpatient Medications on File Prior to Visit  Medication Sig Dispense Refill   Prenatal Vit-Fe Fumarate-FA (MULTIVITAMIN-PRENATAL) 27-0.8 MG TABS tablet Take 1 tablet by mouth daily at 12 noon.     No current facility-administered medications on file prior to visit.    Allergies  Allergen Reactions   Sulfa Antibiotics Anaphylaxis    Social History   Socioeconomic History   Marital status:  Married    Spouse name: Christeen Douglas   Number of children: 1   Years of education: 14   Highest education level: Not on file  Occupational History   Occupation: CNA  Tobacco Use   Smoking status: Never   Smokeless tobacco: Never  Vaping Use   Vaping Use: Never used  Substance and Sexual Activity   Alcohol use: No   Drug use: No   Sexual activity: Yes    Partners: Male    Birth control/protection: None  Other Topics Concern   Not on file  Social History Narrative   Not on file   Social Determinants of Health   Financial Resource Strain: Low Risk  (08/06/2022)   Overall Financial Resource Strain (CARDIA)    Difficulty of Paying Living Expenses: Not very hard  Food Insecurity: No Food Insecurity (08/06/2022)   Hunger Vital Sign    Worried About Running Out of Food in the Last Year: Never true    Ran Out of Food in the Last Year: Never true  Transportation Needs: No Transportation Needs (08/06/2022)   PRAPARE - 13/06/2022 (Medical): No    Lack of Transportation (Non-Medical): No  Physical Activity: Insufficiently Active (08/06/2022)   Exercise Vital Sign    Days of Exercise per Week: 3 days    Minutes of Exercise per Session: 30 min  Stress: No Stress Concern Present (08/06/2022)   [employer] of Occupational Health - Occupational Stress Questionnaire    Feeling of Stress : Not at all  Social Connections: Moderately Integrated (08/06/2022)   Social Connection and Isolation Panel [NHANES]    Frequency of Communication with Friends and Family: Three times a week    Frequency of Social Gatherings with Friends and Family: Three times a week    Attends Religious Services: More than 4 times per year    Active Member of Clubs or Organizations: No    Attends Banker Meetings: Never    Marital Status: Married  Catering manager Violence: Not At Risk (08/06/2022)   Humiliation, Afraid, Rape, and Kick questionnaire    Fear of Current or  Ex-Partner: No    Emotionally Abused: No    Physically Abused: No    Sexually Abused: No    Family History  Problem Relation Age of Onset   Hyperlipidemia Mother    Hypertension Mother    Hyperlipidemia Father    Hypertension Father    Hyperlipidemia Brother    Cancer Maternal Grandmother 64       brain   Stroke Paternal Grandmother    Dementia Paternal Grandmother    Colon cancer Paternal Grandfather 38   Cancer Paternal Grandfather 31       liver    The following portions of the patient's history were reviewed and updated as appropriate: allergies, current medications, past OB history, past medical history, past surgical history, past family history, past social history, and problem list.  History obtained from the patient General ROS: negative for - chills, fever, or night sweats Psychological ROS: positive for - anxiety negative for - depression Ophthalmic ROS: negative for - blurry vision or decreased vision ENT ROS: positive for - headaches negative for - sinus pain or sore throat Hematological and Lymphatic ROS: negative for - bleeding problems, bruising, or swollen lymph nodes Endocrine ROS: negative for - breast changes, palpitations, or polydipsia/polyuria Breast ROS: negative for breast lumps Respiratory ROS: no cough, shortness of breath, or wheezing Cardiovascular ROS: no chest pain or dyspnea on exertion Gastrointestinal ROS: no abdominal pain, change in bowel habits, or black or bloody stools Genito-Urinary ROS: no dysuria, trouble voiding, or hematuria Musculoskeletal ROS: negative Dermatological ROS: negative   OBJECTIVE: Initial Physical Exam (New OB)  GENERAL APPEARANCE: alert, well appearing, in no apparent distress HEAD: normocephalic, atraumatic MOUTH: mucous membranes moist, pharynx normal without lesions THYROID: no thyromegaly or masses present BREASTS: no masses noted, no significant tenderness, no palpable axillary nodes, no skin  changes LUNGS: clear to auscultation, no wheezes, rales or rhonchi, symmetric air entry HEART: regular rate and rhythm, no murmurs ABDOMEN: soft, nontender, nondistended, no abnormal masses, no epigastric pain, fundus not palpable, and FHT present EXTREMITIES: no redness or tenderness in the calves or thighs SKIN: normal coloration and turgor, no rashes LYMPH NODES: no adenopathy palpable NEUROLOGIC: alert, oriented, normal speech, no focal findings or movement disorder noted  PELVIC EXAM EXTERNAL GENITALIA: normal appearing vulva with no masses, tenderness or lesions VAGINA: no abnormal discharge or lesions CERVIX: difficult to visualize cervix. Pap collected  ASSESSMENT: Normal pregnancy [redacted]w[redacted]d H/o preeclampsia   PLAN: Routine prenatal care. We discussed an overview of prenatal care and when to call. Reviewed diet, exercise, and weight gain recommendations in pregnancy. Discussed benefits of breastfeeding and lactation resources at [facility]. Discussed natural method of headache and nausea prevention. I reviewed labs and answered all questions. Baseline preeclampsia  labs and genetic testing collected today. Rx sent for ASA 81 mg to be started at 13 weeks. RTC in 4 weeks.  See orders  Guadlupe Spanish, CNM

## 2022-08-17 LAB — COMPREHENSIVE METABOLIC PANEL
ALT: 8 IU/L (ref 0–32)
AST: 12 IU/L (ref 0–40)
Albumin/Globulin Ratio: 1.7 (ref 1.2–2.2)
Albumin: 3.9 g/dL — ABNORMAL LOW (ref 4.0–5.0)
Alkaline Phosphatase: 67 IU/L (ref 44–121)
BUN/Creatinine Ratio: 14 (ref 9–23)
BUN: 8 mg/dL (ref 6–20)
Bilirubin Total: <0.2 mg/dL (ref 0.0–1.2)
CO2: 23 mmol/L (ref 20–29)
Calcium: 9 mg/dL (ref 8.7–10.2)
Chloride: 105 mmol/L (ref 96–106)
Creatinine, Ser: 0.58 mg/dL (ref 0.57–1.00)
Globulin, Total: 2.3 g/dL (ref 1.5–4.5)
Glucose: 72 mg/dL (ref 70–99)
Potassium: 3.9 mmol/L (ref 3.5–5.2)
Sodium: 141 mmol/L (ref 134–144)
Total Protein: 6.2 g/dL (ref 6.0–8.5)
eGFR: 128 mL/min/{1.73_m2} (ref >59)

## 2022-08-17 LAB — URINALYSIS, ROUTINE W REFLEX MICROSCOPIC
Bilirubin, UA: NEGATIVE
Glucose, UA: NEGATIVE
Ketones, UA: NEGATIVE
Nitrite, UA: NEGATIVE
RBC, UA: NEGATIVE
Specific Gravity, UA: 1.022 (ref 1.005–1.030)
Urobilinogen, Ur: 0.2 mg/dL (ref 0.2–1.0)
pH, UA: 7 (ref 5.0–7.5)

## 2022-08-17 LAB — PROTEIN / CREATININE RATIO, URINE
Creatinine, Urine: 161.8 mg/dL
Protein, Ur: 60.5 mg/dL
Protein/Creat Ratio: 374 mg/g creat — ABNORMAL HIGH (ref 0–200)

## 2022-08-17 LAB — MICROSCOPIC EXAMINATION: Casts: NONE SEEN /lpf

## 2022-08-18 LAB — MONITOR DRUG PROFILE 14(MW)
Amphetamine Scrn, Ur: NEGATIVE ng/mL
BARBITURATE SCREEN URINE: NEGATIVE ng/mL
BENZODIAZEPINE SCREEN, URINE: NEGATIVE ng/mL
Buprenorphine, Urine: NEGATIVE ng/mL
CANNABINOIDS UR QL SCN: NEGATIVE ng/mL
Cocaine (Metab) Scrn, Ur: NEGATIVE ng/mL
Creatinine(Crt), U: 159.5 mg/dL (ref 20.0–300.0)
Fentanyl, Urine: NEGATIVE pg/mL
Meperidine Screen, Urine: NEGATIVE ng/mL
Methadone Screen, Urine: NEGATIVE ng/mL
OXYCODONE+OXYMORPHONE UR QL SCN: NEGATIVE ng/mL
Opiate Scrn, Ur: NEGATIVE ng/mL
Ph of Urine: 7.1 (ref 4.5–8.9)
Phencyclidine Qn, Ur: NEGATIVE ng/mL
Propoxyphene Scrn, Ur: NEGATIVE ng/mL
SPECIFIC GRAVITY: 1.021
Tramadol Screen, Urine: NEGATIVE ng/mL

## 2022-08-18 LAB — NICOTINE SCREEN, URINE: Cotinine Ql Scrn, Ur: NEGATIVE ng/mL

## 2022-08-18 LAB — CYTOLOGY - PAP: Diagnosis: NEGATIVE

## 2022-08-18 LAB — URINE CYTOLOGY ANCILLARY ONLY
Chlamydia: NEGATIVE
Comment: NEGATIVE
Comment: NORMAL
Neisseria Gonorrhea: NEGATIVE

## 2022-08-18 LAB — CULTURE, OB URINE

## 2022-08-18 LAB — URINE CULTURE, OB REFLEX

## 2022-09-01 ENCOUNTER — Telehealth: Payer: Self-pay

## 2022-09-01 ENCOUNTER — Other Ambulatory Visit: Payer: No Typology Code available for payment source

## 2022-09-01 DIAGNOSIS — Z3480 Encounter for supervision of other normal pregnancy, unspecified trimester: Secondary | ICD-10-CM

## 2022-09-01 DIAGNOSIS — Z369 Encounter for antenatal screening, unspecified: Secondary | ICD-10-CM

## 2022-09-01 NOTE — Telephone Encounter (Signed)
Pt called triage requesting f/u on genetic testing. She had labs drawn on 11/20 and has not heard anything. Looks like test was missed when drawing pt. Pt aware and will come to office today to have it drawn.

## 2022-09-05 LAB — MATERNIT 21 PLUS CORE, BLOOD
Fetal Fraction: 12
Result (T21): NEGATIVE
Trisomy 13 (Patau syndrome): NEGATIVE
Trisomy 18 (Edwards syndrome): NEGATIVE
Trisomy 21 (Down syndrome): NEGATIVE

## 2022-09-07 ENCOUNTER — Encounter: Payer: Self-pay | Admitting: Obstetrics

## 2022-09-13 ENCOUNTER — Encounter: Payer: Self-pay | Admitting: Certified Nurse Midwife

## 2022-09-13 ENCOUNTER — Ambulatory Visit (INDEPENDENT_AMBULATORY_CARE_PROVIDER_SITE_OTHER): Payer: No Typology Code available for payment source | Admitting: Certified Nurse Midwife

## 2022-09-13 VITALS — BP 116/75 | HR 84 | Wt 145.4 lb

## 2022-09-13 DIAGNOSIS — Z3A14 14 weeks gestation of pregnancy: Secondary | ICD-10-CM

## 2022-09-13 LAB — POCT URINALYSIS DIPSTICK OB
Bilirubin, UA: NEGATIVE
Blood, UA: NEGATIVE
Glucose, UA: NEGATIVE
Ketones, UA: NEGATIVE
Leukocytes, UA: NEGATIVE
Nitrite, UA: NEGATIVE
Spec Grav, UA: 1.01 (ref 1.010–1.025)
Urobilinogen, UA: 0.2 E.U./dL
pH, UA: 7 (ref 5.0–8.0)

## 2022-09-13 NOTE — Progress Notes (Signed)
ROB doing well, feeling good movement . Denies concerns today. Asked about asparin during pregnancy associated with Pre eclampsia. She is concrned about negative side effects. Reassurance given and encouraged to continue to take daily. She verbalizes and agrees. Discussed u/s for anatomy next appointment. Follow up 5 wks.   Doreene Burke, CNM

## 2022-09-13 NOTE — Patient Instructions (Signed)
Round Ligament Pain  The round ligaments are a pair of cord-like tissues that help support the uterus. They can become a source of pain during pregnancy as the ligaments soften and stretch as the baby grows. The pain usually begins in the second trimester (13-28 weeks) of pregnancy, and should only last for a few seconds when it occurs. However, the pain can come and go until the baby is delivered. The pain does not cause harm to the baby. Round ligament pain is usually a short, sharp, and pinching pain, but it can also be a dull, lingering, and aching pain. The pain is felt in the lower side of the abdomen or in the groin. It usually starts deep in the groin and moves up to the outside of the hip area. The pain may happen when you: Suddenly change position, such as quickly going from a sitting to standing position. Do physical activity. Cough or sneeze. Follow these instructions at home: Managing pain  When the pain starts, relax. Then, try any of these methods to help with the pain: Sit down. Flex your knees up to your abdomen. Lie on your side with one pillow under your abdomen and another pillow between your legs. Sit in a warm bath for 15-20 minutes or until the pain goes away. General instructions Watch your condition for any changes. Move slowly when you sit down or stand up. Stop or reduce your physical activities if they cause pain. Avoid long walks if they cause pain. Take over-the-counter and prescription medicines only as told by your health care provider. Keep all follow-up visits. This is important. Contact a health care provider if: Your pain does not go away with treatment. You feel pain in your back that you did not have before. Your medicine is not helping. You have a fever or chills. You have nausea or vomiting. You have diarrhea. You have pain when you urinate. Get help right away if: You have pain that is a rhythmic, cramping pain similar to labor pains. Labor  pains are usually 2 minutes apart, last for about 1 minute, and involve a bearing down feeling or pressure in your pelvis. You have vaginal bleeding. These symptoms may represent a serious problem that is an emergency. Do not wait to see if the symptoms will go away. Get medical help right away. Call your local emergency services (911 in the [place] Do not drive yourself to the hospital. Summary Round ligament pain is felt in the lower abdomen or groin. This pain usually begins in the second trimester (13-28 weeks) and should only last for a few seconds when it occurs. You may notice the pain when you suddenly change position, when you cough or sneeze, or during physical activity. Relaxing, flexing your knees to your abdomen, lying on one side, or taking a warm bath may help to get rid of the pain. Contact your health care provider if the pain does not go away. This information is not intended to replace advice given to you by your health care provider. Make sure you discuss any questions you have with your health care provider. Document Revised: 11/26/2020 Document Reviewed: 11/26/2020 Elsevier Patient Education  2023 Elsevier Inc.  

## 2022-09-27 NOTE — L&D Delivery Note (Signed)
Delivery Note  Susan Cannon was admitted in active labor, and made rapid progress to complete. She had a strong preference for unmedicated labor and birth.  At  0245,a viable female was delivered vaginally over an intact perineum, with bilateral labial lacerations.via  (Presentation: OA to ROT     ).  APGAR: 9,10 ; weight  .pending Placenta status:  delivered intact at 0252,  .  Cord: 3 vessel  with the following complications: none .    Anesthesia:  none Episiotomy:  none Lacerations:  bilateral labial lacerations, left unrepaired  per patient Suture Repair:  NA Est. Blood Loss (mL):  310  Mom to postpartum.  Baby to Couplet care / Skin to Skin.  [name] 03/10/2023, 3:19 AM

## 2022-10-05 ENCOUNTER — Encounter: Payer: Self-pay | Admitting: Certified Nurse Midwife

## 2022-10-12 ENCOUNTER — Encounter: Payer: Self-pay | Admitting: Advanced Practice Midwife

## 2022-10-12 ENCOUNTER — Ambulatory Visit: Payer: Commercial Managed Care - PPO | Admitting: Advanced Practice Midwife

## 2022-10-12 VITALS — BP 120/80 | Wt 149.0 lb

## 2022-10-12 DIAGNOSIS — Z3482 Encounter for supervision of other normal pregnancy, second trimester: Secondary | ICD-10-CM

## 2022-10-12 DIAGNOSIS — Z3A18 18 weeks gestation of pregnancy: Secondary | ICD-10-CM

## 2022-10-12 NOTE — Progress Notes (Signed)
Routine Prenatal Care Visit  Subjective  Susan Cannon is a 27 y.o. G3P1011 at [redacted]w[redacted]d being seen today for ongoing prenatal care.  She is currently monitored for the following issues for this low-risk pregnancy and has Missed period; Supervision of other normal pregnancy, antepartum; and GAD (generalized anxiety disorder) on their problem list.  ----------------------------------------------------------------------------------- Patient reports  labial and vaginal pressure and swelling. She is concerned for possible bladder prolapse. She also has low mid pelvic pain with certain activity. Pelvic floor PT postpartum recommended .   Contractions: Not present. Vag. Bleeding: None.  Movement: Present. Leaking Fluid denies.  ----------------------------------------------------------------------------------- The following portions of the patient's history were reviewed and updated as appropriate: allergies, current medications, past family history, past medical history, past social history, past surgical history and problem list. Problem list updated.  Objective  Blood pressure 120/80, weight 149 lb (67.6 kg), last menstrual period 05/30/2022, unknown if currently breastfeeding. Pregravid weight 145 lb (65.8 kg) Total Weight Gain 4 lb (1.814 kg) Urinalysis: Urine Protein    Urine Glucose    Fetal Status: Fetal Heart Rate (bpm): 145   Movement: Present     General:  Alert, oriented and cooperative. Patient is in no acute distress.  Skin: Skin is warm and dry. No rash noted.   Cardiovascular: Normal heart rate noted  Respiratory: Normal respiratory effort, no problems with respiration noted  Abdomen: Soft, gravid, appropriate for gestational age. Pain/Pressure: Present     Pelvic:   Labia normal appearing, Mild bladder bulging inside vagina, Moderate squeeze strength         Extremities: Normal range of motion.  Edema: None  Mental Status: Normal mood and affect. Normal behavior. Normal judgment and  thought content.   Assessment   27 y.o. G3P1011 at [redacted]w[redacted]d by  03/11/2023, by Ultrasound presenting for work-in prenatal visit  Plan    Preterm labor symptoms and general obstetric precautions including but not limited to vaginal bleeding, contractions, leaking of fluid and fetal movement were reviewed in detail with the patient. Please refer to After Visit Summary for other counseling recommendations.   Return for scheduled appointment.  Rod Can, CNM 10/12/2022 3:25 PM

## 2022-10-18 ENCOUNTER — Ambulatory Visit (INDEPENDENT_AMBULATORY_CARE_PROVIDER_SITE_OTHER): Payer: 59 | Admitting: Obstetrics

## 2022-10-18 ENCOUNTER — Ambulatory Visit (INDEPENDENT_AMBULATORY_CARE_PROVIDER_SITE_OTHER): Payer: 59

## 2022-10-18 VITALS — BP 118/75 | HR 93 | Wt 151.9 lb

## 2022-10-18 DIAGNOSIS — Z3689 Encounter for other specified antenatal screening: Secondary | ICD-10-CM

## 2022-10-18 DIAGNOSIS — Z3480 Encounter for supervision of other normal pregnancy, unspecified trimester: Secondary | ICD-10-CM

## 2022-10-18 DIAGNOSIS — M6289 Other specified disorders of muscle: Secondary | ICD-10-CM

## 2022-10-18 DIAGNOSIS — Z3A19 19 weeks gestation of pregnancy: Secondary | ICD-10-CM

## 2022-10-18 DIAGNOSIS — Z3A14 14 weeks gestation of pregnancy: Secondary | ICD-10-CM | POA: Diagnosis not present

## 2022-10-18 DIAGNOSIS — Z3482 Encounter for supervision of other normal pregnancy, second trimester: Secondary | ICD-10-CM

## 2022-10-18 NOTE — Progress Notes (Signed)
ROB at [redacted]w[redacted]d. Starting to feel some flutters. Anatomy scan today - reviewed preliminary results. Incomplete views of face/profile and CSP. Follow-up US ordered. Discussed pelvic and hip pain. [place] is seeing a chiropractor regularly. Recommend stretches, pelvic PT. Referral sent. RTC in 4 weeks.  Lurlean Horns, CNM

## 2022-10-29 ENCOUNTER — Other Ambulatory Visit: Payer: Self-pay | Admitting: Certified Nurse Midwife

## 2022-10-29 NOTE — Addendum Note (Signed)
Addended by: Meryl Dare on: 10/29/2022 04:39 PM   Modules accepted: Orders

## 2022-11-03 ENCOUNTER — Telehealth: Payer: Self-pay | Admitting: Obstetrics & Gynecology

## 2022-11-03 NOTE — Telephone Encounter (Signed)
The Patient was originally scheduled for office visit for 2/20 with MD. Patient's imaging is scheduled on 2/28. We are needing to rescheduled office visit for office visit to reflect after imaging appointment. I contacted patient via phone. I left detail message advising patient to call back to rescheduled office visit.

## 2022-11-04 NOTE — Telephone Encounter (Signed)
The patient's office visit has been rescheduled for 3/1 with MS

## 2022-11-16 ENCOUNTER — Other Ambulatory Visit: Payer: 59

## 2022-11-16 ENCOUNTER — Encounter: Payer: 59 | Admitting: Obstetrics & Gynecology

## 2022-11-24 ENCOUNTER — Ambulatory Visit
Admission: RE | Admit: 2022-11-24 | Discharge: 2022-11-24 | Disposition: A | Discharge status: Home / Self Care | Payer: 59 | Source: Ambulatory Visit | Attending: Obstetrics | Admitting: Obstetrics

## 2022-11-24 DIAGNOSIS — Z3480 Encounter for supervision of other normal pregnancy, unspecified trimester: Secondary | ICD-10-CM

## 2022-11-24 DIAGNOSIS — Z3A24 24 weeks gestation of pregnancy: Secondary | ICD-10-CM | POA: Insufficient documentation

## 2022-11-24 DIAGNOSIS — Z3689 Encounter for other specified antenatal screening: Secondary | ICD-10-CM | POA: Insufficient documentation

## 2022-11-26 ENCOUNTER — Encounter: Payer: 59 | Admitting: Obstetrics

## 2022-11-27 ENCOUNTER — Other Ambulatory Visit: Payer: Self-pay | Admitting: Obstetrics

## 2022-11-27 ENCOUNTER — Encounter: Payer: Self-pay | Admitting: Obstetrics

## 2022-11-27 DIAGNOSIS — Z362 Encounter for other antenatal screening follow-up: Secondary | ICD-10-CM

## 2022-11-27 NOTE — Progress Notes (Signed)
Follow-up anatomy scan incomplete for thalami and CSP. Order placed for MFM [place]. Valene notified via [place].  Lurlean Horns, CNM

## 2022-11-29 NOTE — Addendum Note (Signed)
Addended by: Meryl Dare on: 11/29/2022 04:23 PM   Modules accepted: Orders

## 2022-11-30 ENCOUNTER — Ambulatory Visit (INDEPENDENT_AMBULATORY_CARE_PROVIDER_SITE_OTHER): Payer: 59 | Admitting: Obstetrics

## 2022-11-30 ENCOUNTER — Encounter: Payer: Self-pay | Admitting: Obstetrics

## 2022-11-30 VITALS — BP 124/81 | HR 95 | Wt 163.0 lb

## 2022-11-30 DIAGNOSIS — Z3482 Encounter for supervision of other normal pregnancy, second trimester: Secondary | ICD-10-CM

## 2022-11-30 DIAGNOSIS — Z3A25 25 weeks gestation of pregnancy: Secondary | ICD-10-CM

## 2022-11-30 DIAGNOSIS — Z3480 Encounter for supervision of other normal pregnancy, unspecified trimester: Secondary | ICD-10-CM

## 2022-11-30 LAB — POCT URINALYSIS DIPSTICK OB
Bilirubin, UA: NEGATIVE
Blood, UA: NEGATIVE
Glucose, UA: NEGATIVE
Ketones, UA: NEGATIVE
Leukocytes, UA: NEGATIVE
Nitrite, UA: NEGATIVE
POC,PROTEIN,UA: NEGATIVE
Spec Grav, UA: 1.01 (ref 1.010–1.025)
Urobilinogen, UA: 0.2 E.U./dL
pH, UA: 8 (ref 5.0–8.0)

## 2022-11-30 NOTE — Progress Notes (Signed)
Routine Prenatal Care Visit  Subjective  Susan Cannon is a 27 y.o. G3P1011 at 34w4dbeing seen today for ongoing prenatal care.  She is currently monitored for the following issues for this low-risk pregnancy and has Missed period; Supervision of other normal pregnancy, antepartum; and GAD (generalized anxiety disorder) on their problem list.  ----------------------------------------------------------------------------------- Patient reports a few complaints.  She has occas BH ucs. She has had had some upper abdominal pain over the last two weeks. Contractions: Irritability. Vag. Bleeding: None.  Movement: Present. Leaking Fluid denies.  ----------------------------------------------------------------------------------- The following portions of the patient's history were reviewed and updated as appropriate: allergies, current medications, past family history, past medical history, past social history, past surgical history and problem list. Problem list updated.  Objective  Blood pressure 124/81, pulse 95, weight 163 lb (73.9 kg), last menstrual period 05/30/2022, unknown if currently breastfeeding. Pregravid weight 145 lb (65.8 kg) Total Weight Gain 18 lb (8.165 kg) Urinalysis: Urine Protein Negative  Urine Glucose Negative  Fetal Status: Fetal Heart Rate (bpm): 142 Fundal Height: 26 cm Movement: Present     General:  Alert, oriented and cooperative. Patient is in no acute distress.  Skin: Skin is warm and dry. No rash noted.   Cardiovascular: Normal heart rate noted  Respiratory: Normal respiratory effort, no problems with respiration noted  Abdomen: Soft, gravid, appropriate for gestational age. Pain/Pressure: Present     Pelvic:  Cervical exam deferred        Extremities: Normal range of motion.  Edema: Trace  Mental Status: Normal mood and affect. Normal behavior. Normal judgment and thought content.   Assessment   27y.o. G3P1011 at 263w4dy  03/11/2023, by Ultrasound presenting  for routine prenatal visit Up[per abdominal pain  Plan   third Problems (from 08/06/22 to present)     No problems associated with this episode.        Preterm labor symptoms and general obstetric precautions including but not limited to vaginal bleeding, contractions, leaking of fluid and fetal movement were reviewed in detail with the patient. Please refer to After Visit Summary for other counseling recommendations.  She notes that her last anatomy scan incorrectly identified her daughter as female. We will f/u on this.  No follow-ups on file.  MaImagene RichesCNM  11/30/2022 9:59 AM

## 2022-12-15 ENCOUNTER — Other Ambulatory Visit: Payer: 59

## 2022-12-16 ENCOUNTER — Telehealth: Payer: Self-pay

## 2022-12-16 ENCOUNTER — Ambulatory Visit
Admission: EM | Admit: 2022-12-16 | Discharge: 2022-12-16 | Disposition: A | Discharge status: Home / Self Care | Payer: 59 | Attending: Urgent Care | Admitting: Urgent Care

## 2022-12-16 DIAGNOSIS — B9689 Other specified bacterial agents as the cause of diseases classified elsewhere: Secondary | ICD-10-CM

## 2022-12-16 DIAGNOSIS — J019 Acute sinusitis, unspecified: Secondary | ICD-10-CM

## 2022-12-16 LAB — POCT RAPID STREP A (OFFICE): Rapid Strep A Screen: NEGATIVE

## 2022-12-16 MED ORDER — AMOXICILLIN 875 MG PO TABS: 875.0000 mg | ORAL_TABLET | Freq: Two times a day (BID) | ORAL | 0 refills | Status: AC

## 2022-12-16 NOTE — ED Provider Notes (Signed)
[facility] Pal[facility]    CSN: [id number] Arrival date & time: 12/16/22  1104      History   Chief Complaint Chief Complaint  Patient presents with   Sore Throat   Cough   Nasal Congestion    HPI Zayne Pico is a 27 y.o. female.    Sore Throat  Cough   Presents to urgent care with complaint of sore throat, nasal congestion, sinus pain and pressure, productive cough now x 9 days.  Patient states her sore throat seems to be improving however she remains concerned with strep infection.  Patient states she is taking OTC medications which are safe for pregnancy.  Patient was seen for her [redacted]w[redacted]d prenatal visit on 11/30/2022.  PMH also includes GAD.  Past Medical History:  Diagnosis Date   Heart murmur    History of chicken pox    27 y.o.    Patient Active Problem List   Diagnosis Date Noted   Supervision of other normal pregnancy, antepartum 08/06/2022   Missed period 07/23/2022   GAD (generalized anxiety disorder) 11/30/2018    Past Surgical History:  Procedure Laterality Date   DILATION AND EVACUATION N/A 04/21/2020   Procedure: DILATATION AND EVACUATION (SUCTION D&C);  Surgeon: Benjaman Kindler, MD;  Location: [facility] ORS;  Service: Gynecology;  Laterality: N/A;   WISDOM TOOTH EXTRACTION      OB History     Gravida  3   Para  1   Term  1   Preterm      AB  1   Living  1      SAB  1   IAB      Ectopic      Multiple  0   Live Births  1            Home Medications    Prior to Admission medications   Medication Sig Start Date End Date Taking? Authorizing Provider  aspirin EC 81 MG tablet Take 1 tablet (81 mg total) by mouth daily. Swallow whole. Start taking at 13 weeks. 08/16/22   Lurlean Horns, CNM  Prenatal Vit-Fe Fumarate-FA (MULTIVITAMIN-PRENATAL) 27-0.8 MG TABS tablet Take 1 tablet by mouth daily at 12 noon.    [provider]    Family History Family History  Problem Relation Age of Onset   Hyperlipidemia Mother     Hypertension Mother    Hyperlipidemia Father    Hypertension Father    Hyperlipidemia Brother    Cancer Maternal Grandmother 54       brain   Stroke Paternal Grandmother    Dementia Paternal Grandmother    Colon cancer Paternal Grandfather 67   Cancer Paternal Grandfather 65       liver    Social History Social History   Tobacco Use   Smoking status: Never   Smokeless tobacco: Never  Vaping Use   Vaping Use: Never used  Substance Use Topics   Alcohol use: No   Drug use: No     Allergies   Sulfa antibiotics   Review of Systems Review of Systems  Respiratory:  Positive for cough.      Physical Exam Triage Vital Signs ED Triage Vitals [12/16/22 1116]  Enc Vitals Group     BP (!) 143/99     Pulse Rate (S) (!) 128     Resp 18     Temp 98.5 F (36.9 C)     Temp Source Temporal     SpO2 97 %  Weight      Height      Head Circumference      Peak Flow      Pain Score      Pain Loc      Pain Edu?      Excl. in [place]?    No data found.  Updated Vital Signs BP (!) 143/99 (BP Location: Left Arm)   Pulse (S) (!) 128   Temp 98.5 F (36.9 C) (Temporal)   Resp 18   LMP 05/30/2022 (Exact Date)   SpO2 97%   Visual Acuity Right Eye Distance:   Left Eye Distance:   Bilateral Distance:    Right Eye Near:   Left Eye Near:    Bilateral Near:     Physical Exam Vitals reviewed.  Constitutional:      Appearance: She is well-developed.  HENT:     Mouth/Throat:     Mouth: Mucous membranes are moist.     Pharynx: Posterior oropharyngeal erythema present. No oropharyngeal exudate.  Cardiovascular:     Rate and Rhythm: Normal rate and regular rhythm.     Heart sounds: Normal heart sounds.  Pulmonary:     Effort: Pulmonary effort is normal.     Breath sounds: Normal breath sounds.  Skin:    General: Skin is warm and dry.  Neurological:     General: No focal deficit present.     Mental Status: She is alert and oriented to person, place, and time.   Psychiatric:        Mood and Affect: Mood normal.        Behavior: Behavior normal.      UC Treatments / Results  Labs (all labs ordered are listed, but only abnormal results are displayed) Labs Reviewed  POCT RAPID STREP A (OFFICE)    EKG   Radiology No results found.  Procedures Procedures (including critical care time)  Medications Ordered in UC Medications - No data to display  Initial Impression / Assessment and Plan / UC Course  I have reviewed the triage vital signs and the nursing notes.  Pertinent labs & imaging results that were available during my care of the patient were reviewed by me and considered in my medical decision making (see chart for details).   Patient is afebrile here without recent antipyretics. Satting well on room air. Overall is well appearing, well hydrated, without respiratory distress. Pulmonary exam is unremarkable.  Lungs CTAB without wheezing, rhonchi, rales.  Mild pharyngeal erythema is present.  No peritonsillar exudates.  Given duration of patient's symptoms, concern for secondary bacterial infection, especially in the context of worsening symptoms in the last day or 2.  Will treat for bacterial sinusitis with amoxicillin 875 mg which is considered safe in pregnancy.  Also recommended use of Flonase nasal spray and nasal lavage.  Patient acknowledges understanding and agreement with this treatment plan.  Final Clinical Impressions(s) / UC Diagnoses   Final diagnoses:  None   Discharge Instructions   None    ED Prescriptions   None    PDMP not reviewed this encounter.   Rose Phi, [place] 12/16/22 1152

## 2022-12-16 NOTE — Discharge Instructions (Addendum)
Follow up here or with your primary care provider if your symptoms are worsening or not improving with treatment.     

## 2022-12-16 NOTE — ED Triage Notes (Signed)
Patient presents to [facility] for sore throat, congestion, sinus pressure and productive cough since last Tuesday. States throat is getting better. Concerned with strep. Taking OTC cold meds safe for  pregnancy.

## 2022-12-16 NOTE — Telephone Encounter (Signed)
Pt calling; has been sick for a week and not getting better; has sore throat, green mucus, head congestion, cough.  Can she get tested here? What is safe for her to take?  [phone]  Pt sound really congested; adv safe meds list sent to her MyChart; we do not test here; she can go to PCP, Urgent Care, HD, [facility]; adv can rx Tamiflu or did she want to wait and see if she has the flu; pt opts to wait; adv to let me know.

## 2022-12-20 ENCOUNTER — Ambulatory Visit: Payer: 59

## 2022-12-20 ENCOUNTER — Other Ambulatory Visit: Payer: Self-pay

## 2022-12-20 ENCOUNTER — Ambulatory Visit: Payer: 59 | Attending: Maternal & Fetal Medicine

## 2022-12-20 DIAGNOSIS — Z3A28 28 weeks gestation of pregnancy: Secondary | ICD-10-CM

## 2022-12-20 DIAGNOSIS — O09293 Supervision of pregnancy with other poor reproductive or obstetric history, third trimester: Secondary | ICD-10-CM

## 2022-12-20 DIAGNOSIS — Z362 Encounter for other antenatal screening follow-up: Secondary | ICD-10-CM | POA: Insufficient documentation

## 2022-12-21 ENCOUNTER — Encounter: Payer: Self-pay | Admitting: Obstetrics

## 2022-12-22 ENCOUNTER — Encounter: Payer: Self-pay | Admitting: Obstetrics and Gynecology

## 2022-12-22 ENCOUNTER — Other Ambulatory Visit: Payer: 59

## 2022-12-22 ENCOUNTER — Ambulatory Visit (INDEPENDENT_AMBULATORY_CARE_PROVIDER_SITE_OTHER): Payer: 59 | Admitting: Obstetrics and Gynecology

## 2022-12-22 VITALS — BP 112/72 | HR 101 | Wt 167.7 lb

## 2022-12-22 DIAGNOSIS — Z3480 Encounter for supervision of other normal pregnancy, unspecified trimester: Secondary | ICD-10-CM | POA: Diagnosis not present

## 2022-12-22 DIAGNOSIS — O09293 Supervision of pregnancy with other poor reproductive or obstetric history, third trimester: Secondary | ICD-10-CM

## 2022-12-22 DIAGNOSIS — Z3A28 28 weeks gestation of pregnancy: Secondary | ICD-10-CM

## 2022-12-22 DIAGNOSIS — Z2821 Immunization not carried out because of patient refusal: Secondary | ICD-10-CM

## 2022-12-22 DIAGNOSIS — Z3482 Encounter for supervision of other normal pregnancy, second trimester: Secondary | ICD-10-CM

## 2022-12-22 DIAGNOSIS — O0990 Supervision of high risk pregnancy, unspecified, unspecified trimester: Secondary | ICD-10-CM

## 2022-12-22 HISTORY — DX: Supervision of pregnancy with other poor reproductive or obstetric history, third trimester: O09.293

## 2022-12-22 LAB — POCT URINALYSIS DIPSTICK OB
Bilirubin, UA: NEGATIVE
Blood, UA: NEGATIVE
Ketones, UA: NEGATIVE
Leukocytes, UA: NEGATIVE
Nitrite, UA: NEGATIVE
POC,PROTEIN,UA: NEGATIVE
Spec Grav, UA: 1.015 (ref 1.010–1.025)
Urobilinogen, UA: 0.2 E.U./dL
pH, UA: 7.5 (ref 5.0–8.0)

## 2022-12-22 NOTE — Progress Notes (Signed)
ROB [redacted]w[redacted]d: She is doing well. Has been having some Braxton Hick's contractions. She reports good fetal movement. She declined TDAP. BTC form was signed.

## 2022-12-22 NOTE — Progress Notes (Signed)
ROB: Patient is a 27 y.o. G3P1011 at [redacted]w[redacted]d who presents for OB care.  Pregnancy is complicated by Pregnancy, supervision, high-risk; GAD (generalized anxiety disorder); and History of pre-eclampsia in prior pregnancy, currently pregnant in third trimester. Patient has no complaints today.  For 28 week labs today.  Plans to  plans to breastfeed, desires Natural Family Planning and Condoms for contraception. Declined Tdap today, signed blood consent.  Discussed pain management in labor, desires natural childbirth. RTC in 2 weeks for ROB.

## 2022-12-23 ENCOUNTER — Encounter: Payer: Self-pay | Admitting: Obstetrics and Gynecology

## 2022-12-23 LAB — 28 WEEK RH+PANEL
Basophils Absolute: 0 10*3/uL (ref 0.0–0.2)
Basos: 0 %
EOS (ABSOLUTE): 0.2 10*3/uL (ref 0.0–0.4)
Eos: 2 %
Gestational Diabetes Screen: 99 mg/dL (ref 70–139)
HIV Screen 4th Generation wRfx: NONREACTIVE
Hematocrit: 36.4 % (ref 34.0–46.6)
Hemoglobin: 12.6 g/dL (ref 11.1–15.9)
Immature Grans (Abs): 0.3 10*3/uL — ABNORMAL HIGH (ref 0.0–0.1)
Immature Granulocytes: 2 %
Lymphocytes Absolute: 2 10*3/uL (ref 0.7–3.1)
Lymphs: 14 %
MCH: 32.1 pg (ref 26.6–33.0)
MCHC: 34.6 g/dL (ref 31.5–35.7)
MCV: 93 fL (ref 79–97)
Monocytes Absolute: 0.7 10*3/uL (ref 0.1–0.9)
Monocytes: 5 %
Neutrophils Absolute: 10.9 10*3/uL — ABNORMAL HIGH (ref 1.4–7.0)
Neutrophils: 77 %
Platelets: 279 10*3/uL (ref 150–450)
RBC: 3.93 x10E6/uL (ref 3.77–5.28)
RDW: 11.9 % (ref 11.7–15.4)
RPR Ser Ql: NONREACTIVE
WBC: 14.2 10*3/uL — ABNORMAL HIGH (ref 3.4–10.8)

## 2023-01-10 DIAGNOSIS — Z3483 Encounter for supervision of other normal pregnancy, third trimester: Secondary | ICD-10-CM | POA: Diagnosis not present

## 2023-01-10 DIAGNOSIS — Z3482 Encounter for supervision of other normal pregnancy, second trimester: Secondary | ICD-10-CM | POA: Diagnosis not present

## 2023-01-13 ENCOUNTER — Ambulatory Visit (INDEPENDENT_AMBULATORY_CARE_PROVIDER_SITE_OTHER): Payer: 59 | Admitting: Obstetrics

## 2023-01-13 VITALS — BP 124/80 | HR 99 | Wt 174.1 lb

## 2023-01-13 DIAGNOSIS — Z3483 Encounter for supervision of other normal pregnancy, third trimester: Secondary | ICD-10-CM

## 2023-01-13 DIAGNOSIS — Z3482 Encounter for supervision of other normal pregnancy, second trimester: Secondary | ICD-10-CM

## 2023-01-13 DIAGNOSIS — Z3A31 31 weeks gestation of pregnancy: Secondary | ICD-10-CM

## 2023-01-13 LAB — POCT URINALYSIS DIPSTICK OB
Bilirubin, UA: NEGATIVE
Blood, UA: NEGATIVE
Glucose, UA: NEGATIVE
Ketones, UA: NEGATIVE
Leukocytes, UA: NEGATIVE
Nitrite, UA: NEGATIVE
POC,PROTEIN,UA: NEGATIVE
Spec Grav, UA: 1.01 (ref 1.010–1.025)
Urobilinogen, UA: 0.2 E.U./dL
pH, UA: 6.5 (ref 5.0–8.0)

## 2023-01-13 NOTE — Progress Notes (Signed)
Routine Prenatal Care Visit  Subjective  Susan Cannon is a 27 y.o. G3P1011 at [redacted]w[redacted]d being seen today for ongoing prenatal care.  She is currently monitored for the following issues for this low-risk pregnancy and has Pregnancy, supervision, high-risk; GAD (generalized anxiety disorder); History of pre-eclampsia in prior pregnancy, currently pregnant in third trimester; and Tetanus, diphtheria, and acellular pertussis (Tdap) vaccination declined on their problem list.  ----------------------------------------------------------------------------------- Patient reports no complaints.   Contractions: Irritability. Vag. Bleeding: None.  Movement: Present. Leaking Fluid denies.  ----------------------------------------------------------------------------------- The following portions of the patient's history were reviewed and updated as appropriate: allergies, current medications, past family history, past medical history, past social history, past surgical history and problem list. Problem list updated.  Objective  Blood pressure 124/80, pulse 99, weight 174 lb 1.6 oz (79 kg), last menstrual period 05/30/2022, unknown if currently breastfeeding. Pregravid weight 145 lb (65.8 kg) Total Weight Gain 29 lb 1.6 oz (13.2 kg) Urinalysis: Urine Protein    Urine Glucose    Fetal Status:     Movement: Present     General:  Alert, oriented and cooperative. Patient is in no acute distress.  Skin: Skin is warm and dry. No rash noted.   Cardiovascular: Normal heart rate noted  Respiratory: Normal respiratory effort, no problems with respiration noted  Abdomen: Soft, gravid, appropriate for gestational age. Pain/Pressure: Absent     Pelvic:  Cervical exam deferred        Extremities: Normal range of motion.  Edema: Trace  Mental Status: Normal mood and affect. Normal behavior. Normal judgment and thought content.   Assessment   27 y.o. G3P1011 at [redacted]w[redacted]d by  03/11/2023, by Ultrasound presenting for routine  prenatal visit  Plan   third Problems (from 08/06/22 to present)     No problems associated with this episode.        Preterm labor symptoms and general obstetric precautions including but not limited to vaginal bleeding, contractions, leaking of fluid and fetal movement were reviewed in detail with the patient. Please refer to After Visit Summary for other counseling recommendations.  We reviewed her 28 week labs. She expresses some fear of having Pre eclampsia again, I have urged her to resume taking daily ASA.  Return in about 2 weeks (around 01/27/2023) for return OB.  [name], CNM  01/13/2023 1:27 PM

## 2023-01-27 ENCOUNTER — Ambulatory Visit (INDEPENDENT_AMBULATORY_CARE_PROVIDER_SITE_OTHER): Payer: 59 | Admitting: Obstetrics

## 2023-01-27 ENCOUNTER — Encounter: Payer: Self-pay | Admitting: Obstetrics

## 2023-01-27 ENCOUNTER — Encounter: Payer: Self-pay | Admitting: Certified Nurse Midwife

## 2023-01-27 VITALS — BP 129/74 | HR 88 | Wt 172.0 lb

## 2023-01-27 DIAGNOSIS — O09293 Supervision of pregnancy with other poor reproductive or obstetric history, third trimester: Secondary | ICD-10-CM

## 2023-01-27 DIAGNOSIS — Z3A33 33 weeks gestation of pregnancy: Secondary | ICD-10-CM

## 2023-01-27 DIAGNOSIS — O0990 Supervision of high risk pregnancy, unspecified, unspecified trimester: Secondary | ICD-10-CM

## 2023-01-27 LAB — POCT URINALYSIS DIPSTICK OB
Bilirubin, UA: NEGATIVE
Blood, UA: NEGATIVE
Glucose, UA: NEGATIVE
Ketones, UA: NEGATIVE
Leukocytes, UA: NEGATIVE
Nitrite, UA: NEGATIVE
POC,PROTEIN,UA: NEGATIVE
Spec Grav, UA: 1.015 (ref 1.010–1.025)
Urobilinogen, UA: 1 E.U./dL
pH, UA: 7.5 (ref 5.0–8.0)

## 2023-01-27 NOTE — Progress Notes (Signed)
Subjective:    Susan Cannon is a 27 y.o. G3P1011 [redacted]w[redacted]d being seen today for her obstetrical visit.  Patient reports no complaints. Fetal movement: normal.  Objective:    BP 129/74   Pulse 88   Wt 172 lb (78 kg)   LMP 05/30/2022 (Exact Date)   BMI 28.62 kg/m   Physical Exam Vitals and nursing note reviewed.  Constitutional:      Appearance: Normal appearance.  Cardiovascular:     Rate and Rhythm: Normal rate.  Pulmonary:     Effort: Pulmonary effort is normal.  Skin:    General: Skin is warm and dry.  Neurological:     General: No focal deficit present.     Mental Status: She is alert and oriented to person, place, and time.  Psychiatric:        Mood and Affect: Mood normal.        Behavior: Behavior normal.       FHT: Fetal Heart Rate (bpm): 145  Uterine Size: Fundal Height: 34 cm  Presentation: Presentation: Vertex     Assessment:    Pregnancy:  G3P1011 at [redacted]w[redacted]d. Prenatal record reviewed, normotensive, signs & symptoms of pre-eclampsia reviewed. Birth preferences discussed, desires low intervention, physiologic labor/birth. Delayed cord clamping & skin to skin without separation important.    Plan:    Patient Active Problem List   Diagnosis Date Noted   History of pre-eclampsia in prior pregnancy, currently pregnant in third trimester 12/22/2022   Tetanus, diphtheria, and acellular pertussis (Tdap) vaccination declined 12/22/2022   Pregnancy, supervision, high-risk 08/06/2022   GAD (generalized anxiety disorder) 11/30/2018    Infant feeding: plans to breastfeed. Follow up in 2 Weeks.

## 2023-02-10 ENCOUNTER — Encounter: Payer: Self-pay | Admitting: Licensed Practical Nurse

## 2023-02-10 ENCOUNTER — Ambulatory Visit (INDEPENDENT_AMBULATORY_CARE_PROVIDER_SITE_OTHER): Payer: 59 | Admitting: Licensed Practical Nurse

## 2023-02-10 ENCOUNTER — Other Ambulatory Visit (HOSPITAL_COMMUNITY)
Admission: RE | Admit: 2023-02-10 | Discharge: 2023-02-10 | Disposition: A | Discharge status: Home / Self Care | Payer: 59 | Source: Ambulatory Visit | Attending: Licensed Practical Nurse | Admitting: Licensed Practical Nurse

## 2023-02-10 VITALS — BP 127/79 | HR 82 | Wt 173.5 lb

## 2023-02-10 DIAGNOSIS — Z3A35 35 weeks gestation of pregnancy: Secondary | ICD-10-CM

## 2023-02-10 DIAGNOSIS — O0993 Supervision of high risk pregnancy, unspecified, third trimester: Secondary | ICD-10-CM

## 2023-02-10 DIAGNOSIS — O0990 Supervision of high risk pregnancy, unspecified, unspecified trimester: Secondary | ICD-10-CM | POA: Diagnosis not present

## 2023-02-10 LAB — POCT URINALYSIS DIPSTICK
Bilirubin, UA: NEGATIVE
Blood, UA: NEGATIVE
Glucose, UA: NEGATIVE
Ketones, UA: NEGATIVE
Leukocytes, UA: NEGATIVE
Nitrite, UA: NEGATIVE
Protein, UA: NEGATIVE
Spec Grav, UA: 1.01 (ref 1.010–1.025)
Urobilinogen, UA: 1 E.U./dL
pH, UA: 6 (ref 5.0–8.0)

## 2023-02-10 NOTE — Progress Notes (Signed)
Routine Prenatal Care Visit  Subjective  Susan Cannon is a 27 y.o. G3P1011 at [redacted]w[redacted]d being seen today for ongoing prenatal care.  She is currently monitored for the following issues for this high-risk pregnancy and has Pregnancy, supervision, high-risk; GAD (generalized anxiety disorder); History of pre-eclampsia in prior pregnancy, currently pregnant in third trimester; and Tetanus, diphtheria, and acellular pertussis (Tdap) vaccination declined on their problem list.  ----------------------------------------------------------------------------------- Patient reports  pelvic pain ian pressure .  Feeling good. Mood is good. Had baby blues and  rage PP, is concerned that this will happen again. Reviewed strategies to mitigate rage, however PP rage can be overwhelming and some pts benefit from Prozac-pt will keep an open mind. -her MIL will watch their child while in labor, they currently live with their in laws. Her husband will get 8 wks off for the baby, she will take 12 weeks. Breastfed until she became pregnant.  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   Contractions: Irritability. Vag. Bleeding: None.  Movement: Present. Leaking Fluid denies.  ----------------------------------------------------------------------------------- The following portions of the patient's history were reviewed and updated as appropriate: allergies, current medications, past family history, past medical history, past social history, past surgical history and problem list. Problem list updated.  Objective  Blood pressure 127/79, pulse 82, weight 173 lb 8 oz (78.7 kg), last menstrual period 05/30/2022, unknown if currently breastfeeding. Pregravid weight 145 lb (65.8 kg) Total Weight Gain 28 lb 8 oz (12.9 kg) Urinalysis: Urine Protein    Urine Glucose    Fetal Status: Fetal Heart Rate (bpm): 133 Fundal Height: 35 cm Movement: Present     General:  Alert, oriented and  cooperative. Patient is in no acute distress.  Skin: Skin is warm and dry. No rash noted.   Cardiovascular: Normal heart rate noted  Respiratory: Normal respiratory effort, no problems with respiration noted  Abdomen: Soft, gravid, appropriate for gestational age. Pain/Pressure: Present     Pelvic:  Cervical exam deferred        Extremities: Normal range of motion.     Mental Status: Normal mood and affect. Normal behavior. Normal judgment and thought content.   Assessment   27 y.o. G3P1011 at [redacted]w[redacted]d by  03/11/2023, by Ultrasound presenting for routine prenatal visit  Plan   third Problems (from 08/06/22 to present)     No problems associated with this episode.        Preterm labor symptoms and general obstetric precautions including but not limited to vaginal bleeding, contractions, leaking of fluid and fetal movement were reviewed in detail with the patient. Please refer to After Visit Summary for other counseling recommendations.   Return in about 1 week (around 02/17/2023) for ROB.  36wk labs collected   Carie Caddy, [place]  [facility]  02/10/23  1:37 PM

## 2023-02-12 LAB — STREP GP B NAA: Strep Gp B NAA: NEGATIVE

## 2023-02-14 LAB — CERVICOVAGINAL ANCILLARY ONLY
Chlamydia: NEGATIVE
Comment: NEGATIVE
Comment: NORMAL
Neisseria Gonorrhea: NEGATIVE

## 2023-02-18 ENCOUNTER — Encounter: Payer: Self-pay | Admitting: Certified Nurse Midwife

## 2023-02-18 ENCOUNTER — Ambulatory Visit (INDEPENDENT_AMBULATORY_CARE_PROVIDER_SITE_OTHER): Payer: 59 | Admitting: Certified Nurse Midwife

## 2023-02-18 VITALS — BP 118/82 | HR 105 | Wt 175.0 lb

## 2023-02-18 DIAGNOSIS — Z3A37 37 weeks gestation of pregnancy: Secondary | ICD-10-CM

## 2023-02-18 DIAGNOSIS — Z3483 Encounter for supervision of other normal pregnancy, third trimester: Secondary | ICD-10-CM

## 2023-02-18 LAB — POCT URINALYSIS DIPSTICK OB
Bilirubin, UA: NEGATIVE
Blood, UA: NEGATIVE
Glucose, UA: NEGATIVE
Ketones, UA: NEGATIVE
Leukocytes, UA: NEGATIVE
Nitrite, UA: NEGATIVE
POC,PROTEIN,UA: NEGATIVE
Spec Grav, UA: 1.01 (ref 1.010–1.025)
Urobilinogen, UA: 1 E.U./dL
pH, UA: 6 (ref 5.0–8.0)

## 2023-02-18 NOTE — Progress Notes (Signed)
ROB doing well, feeling good movement. Reviewed labor precautions. Discussed vag exam prn. She declines at this time. She denies any symptoms of pre eclampsia at this time. Noted she had a little swelling earlier this week but has since resolved. Follow up 1 wk .   Susan Cannon Susan Cannon,CNM

## 2023-02-18 NOTE — Addendum Note (Signed)
Addended by: Cornelius Moras D on: 02/18/2023 01:29 PM   Modules accepted: Orders

## 2023-02-25 ENCOUNTER — Ambulatory Visit (INDEPENDENT_AMBULATORY_CARE_PROVIDER_SITE_OTHER): Payer: 59 | Admitting: Certified Nurse Midwife

## 2023-02-25 ENCOUNTER — Encounter: Payer: Self-pay | Admitting: Certified Nurse Midwife

## 2023-02-25 VITALS — BP 123/83 | HR 116 | Wt 179.1 lb

## 2023-02-25 DIAGNOSIS — Z3483 Encounter for supervision of other normal pregnancy, third trimester: Secondary | ICD-10-CM

## 2023-02-25 DIAGNOSIS — Z3A38 38 weeks gestation of pregnancy: Secondary | ICD-10-CM

## 2023-02-25 NOTE — Patient Instructions (Signed)
.  Braxton Hicks Contractions  Contractions of the uterus can occur throughout pregnancy, but they are not always a sign that you are in labor. You may have practice contractions called Braxton Hicks contractions. These false labor contractions are sometimes confused with true labor. What are Braxton Hicks contractions? Braxton Hicks contractions are tightening movements that occur in the muscles of the uterus before labor. Unlike true labor contractions, these contractions do not result in opening (dilation) and thinning of the lowest part of the uterus (cervix). Toward the end of pregnancy (32-34 weeks), Braxton Hicks contractions can happen more often and may become stronger. These contractions are sometimes difficult to tell apart from true labor because they can be very uncomfortable. How to tell the difference between true labor and false labor True labor Contractions last 30-70 seconds. Contractions become very regular. Discomfort is usually felt in the top of the uterus, and it spreads to the lower abdomen and low back. Contractions do not go away with walking. Contractions usually become stronger and more frequent. The cervix dilates and gets thinner. False labor Contractions are usually shorter, weaker, and farther apart than true labor contractions. Contractions are usually irregular. Contractions are often felt in the front of the lower abdomen and in the groin. Contractions may go away when you walk around or change positions while lying down. The cervix usually does not dilate or become thin. Sometimes, the only way to tell if you are in true labor is for your health care provider to look for changes in your cervix. Your health care provider will do a physical exam and may monitor your contractions. If you are in true labor, your health care provider will send you home with instructions about when to return to the hospital. You may continue to have Braxton Hicks contractions until  you go into true labor. Follow these instructions at home:  Take over-the-counter and prescription medicines only as told by your health care provider. If Braxton Hicks contractions are making you uncomfortable: Change your position from lying down or resting to walking, or change from walking to resting. Sit and rest in a tub of warm water. Drink enough fluid to keep your urine pale yellow. Dehydration may cause these contractions. Do slow and deep breathing several times an hour. Keep all follow-up visits. This is important. Contact a health care provider if: You have a fever. You have continuous pain in your abdomen. Your contractions become stronger, more regular, and closer together. You pass blood-tinged mucus. Get help right away if: You have fluid leaking or gushing from your vagina. You have bright red blood coming from your vagina. Your baby is not moving inside you as much as it used to. Summary You may have practice contractions called Braxton Hicks contractions. These false labor contractions are sometimes confused with true labor. Braxton Hicks contractions are usually shorter, weaker, farther apart, and less regular than true labor contractions. True labor contractions usually become stronger, more regular, and more frequent. Manage discomfort from Braxton Hicks contractions by changing position, resting in a warm bath, practicing deep breathing, and drinking plenty of water. Keep all follow-up visits. Contact your health care provider if your contractions become stronger, more regular, and closer together. This information is not intended to replace advice given to you by your health care provider. Make sure you discuss any questions you have with your health care provider. Document Revised: 07/21/2020 Document Reviewed: 07/21/2020 Elsevier Patient Education  2024 Elsevier Inc.  

## 2023-02-25 NOTE — Progress Notes (Signed)
ROB , feeling regular fetal movement.  Declined sve today. Lepolds suggest vertex position. Herbal prep hand out given. Follow up 1 wk.   Doreene Burke, CNM

## 2023-03-04 ENCOUNTER — Ambulatory Visit (INDEPENDENT_AMBULATORY_CARE_PROVIDER_SITE_OTHER): Payer: 59 | Admitting: Licensed Practical Nurse

## 2023-03-04 VITALS — BP 129/79 | HR 76 | Wt 177.6 lb

## 2023-03-04 DIAGNOSIS — O99343 Other mental disorders complicating pregnancy, third trimester: Secondary | ICD-10-CM

## 2023-03-04 DIAGNOSIS — O0990 Supervision of high risk pregnancy, unspecified, unspecified trimester: Secondary | ICD-10-CM

## 2023-03-04 DIAGNOSIS — F411 Generalized anxiety disorder: Secondary | ICD-10-CM

## 2023-03-04 DIAGNOSIS — O26893 Other specified pregnancy related conditions, third trimester: Secondary | ICD-10-CM

## 2023-03-04 DIAGNOSIS — Z3A39 39 weeks gestation of pregnancy: Secondary | ICD-10-CM

## 2023-03-04 LAB — POCT URINALYSIS DIPSTICK
Bilirubin, UA: NEGATIVE
Blood, UA: NEGATIVE
Glucose, UA: NEGATIVE
Ketones, UA: NEGATIVE
Leukocytes, UA: NEGATIVE
Nitrite, UA: NEGATIVE
Protein, UA: NEGATIVE
Spec Grav, UA: 1.015 (ref 1.010–1.025)
Urobilinogen, UA: 0.2 E.U./dL
pH, UA: 6.5 (ref 5.0–8.0)

## 2023-03-04 NOTE — Progress Notes (Signed)
Routine Prenatal Care Visit  Subjective  Susan Cannon is a 27 y.o. G3P1011 at [redacted]w[redacted]d being seen today for ongoing prenatal care.  She is currently monitored for the following issues for this high-risk pregnancy and has Pregnancy, supervision, high-risk; GAD (generalized anxiety disorder); History of pre-eclampsia in prior pregnancy, currently pregnant in third trimester; and Tetanus, diphtheria, and acellular pertussis (Tdap) vaccination declined on their problem list.  ----------------------------------------------------------------------------------- Patient reports no complaints.   -Doing well. Feels ready for baby  -reviewed postdate POC, IOL on 6/21 at 0800, understands method of induction is based on cervical exam.  She was induced at 37wks5d with her last pregnancy.   Contractions: Irritability. Vag. Bleeding: None.  Movement: Present. Leaking Fluid denies.  ----------------------------------------------------------------------------------- The following portions of the patient's history were reviewed and updated as appropriate: allergies, current medications, past family history, past medical history, past social history, past surgical history and problem list. Problem list updated.  Objective  Blood pressure 129/79, pulse 76, weight 177 lb 9.6 oz (80.6 kg), last menstrual period 05/30/2022, unknown if currently breastfeeding. Pregravid weight 145 lb (65.8 kg) Total Weight Gain 32 lb 9.6 oz (14.8 kg) Urinalysis: Urine Protein    Urine Glucose    Fetal Status: Fetal Heart Rate (bpm): 130 Fundal Height: 39 cm Movement: Present     General:  Alert, oriented and cooperative. Patient is in no acute distress.  Skin: Skin is warm and dry. No rash noted.   Cardiovascular: Normal heart rate noted  Respiratory: Normal respiratory effort, no problems with respiration noted  Abdomen: Soft, gravid, appropriate for gestational age. Pain/Pressure: Present     Pelvic:  Cervical exam deferred         Extremities: Normal range of motion.  Edema: Trace  Mental Status: Normal mood and affect. Normal behavior. Normal judgment and thought content.   Assessment   27 y.o. G3P1011 at [redacted]w[redacted]d by  03/11/2023, by Ultrasound presenting for routine prenatal visit  Plan   third Problems (from 08/06/22 to present)     No problems associated with this episode.        Term labor symptoms and general obstetric precautions including but not limited to vaginal bleeding, contractions, leaking of fluid and fetal movement were reviewed in detail with the patient. Please refer to After Visit Summary for other counseling recommendations.   RTC 1 week BPP ordered to happen after 40 weeks IOL scheduled on 03/18/23 at 0800, orders placed   Carie Caddy, [place]  [facility]  03/04/23  5:52 PM

## 2023-03-09 ENCOUNTER — Inpatient Hospital Stay
Admission: EM | Admit: 2023-03-09 | Discharge: 2023-03-11 | DRG: 807 | Disposition: A | Discharge status: Home / Self Care | Payer: 59 | Attending: Obstetrics and Gynecology | Admitting: Obstetrics and Gynecology

## 2023-03-09 DIAGNOSIS — Z8249 Family history of ischemic heart disease and other diseases of the circulatory system: Secondary | ICD-10-CM

## 2023-03-09 DIAGNOSIS — Z3A39 39 weeks gestation of pregnancy: Secondary | ICD-10-CM

## 2023-03-09 DIAGNOSIS — O0990 Supervision of high risk pregnancy, unspecified, unspecified trimester: Secondary | ICD-10-CM

## 2023-03-09 NOTE — OB Triage Note (Signed)
Patient arrived in triage with c/o contractions that intensified around 2215 this evening. Reports good fetal movement. Denies leaking of fluid or vaginal bleeding. Monitors applied and assessing.

## 2023-03-10 ENCOUNTER — Other Ambulatory Visit: Payer: Self-pay

## 2023-03-10 ENCOUNTER — Encounter: Payer: Self-pay | Admitting: Obstetrics and Gynecology

## 2023-03-10 DIAGNOSIS — O26893 Other specified pregnancy related conditions, third trimester: Secondary | ICD-10-CM | POA: Diagnosis present

## 2023-03-10 DIAGNOSIS — Z3A39 39 weeks gestation of pregnancy: Secondary | ICD-10-CM

## 2023-03-10 DIAGNOSIS — Z8249 Family history of ischemic heart disease and other diseases of the circulatory system: Secondary | ICD-10-CM | POA: Diagnosis not present

## 2023-03-10 LAB — CBC
HCT: 42.8 % (ref 36.0–46.0)
Hemoglobin: 14.5 g/dL (ref 12.0–15.0)
MCH: 30.8 pg (ref 26.0–34.0)
MCHC: 33.9 g/dL (ref 30.0–36.0)
MCV: 90.9 fL (ref 80.0–100.0)
Platelets: 250 10*3/uL (ref 150–400)
RBC: 4.71 MIL/uL (ref 3.87–5.11)
RDW: 13.4 % (ref 11.5–15.5)
WBC: 18.1 10*3/uL — ABNORMAL HIGH (ref 4.0–10.5)
nRBC: 0 % (ref 0.0–0.2)

## 2023-03-10 MED ORDER — DIBUCAINE (PERIANAL) 1 % EX OINT: 1.0000 | TOPICAL_OINTMENT | CUTANEOUS | Status: DC | PRN

## 2023-03-10 MED ORDER — ONDANSETRON HCL 4 MG PO TABS: 4.0000 mg | ORAL_TABLET | ORAL | Status: DC | PRN

## 2023-03-10 MED ORDER — LACTATED RINGERS IV SOLN: INTRAVENOUS | Status: DC

## 2023-03-10 MED ORDER — LIDOCAINE HCL (PF) 1 % IJ SOLN
INTRAMUSCULAR | Status: AC
  Filled 2023-03-10: qty 30

## 2023-03-10 MED ORDER — WITCH HAZEL-GLYCERIN EX PADS
1.0000 | MEDICATED_PAD | CUTANEOUS | Status: DC | PRN
  Administered 2023-03-10: 1 via TOPICAL
  Filled 2023-03-10: qty 100

## 2023-03-10 MED ORDER — ONDANSETRON HCL 4 MG/2ML IJ SOLN: 4.0000 mg | INTRAMUSCULAR | Status: DC | PRN

## 2023-03-10 MED ORDER — BENZOCAINE-MENTHOL 20-0.5 % EX AERO
1.0000 | INHALATION_SPRAY | CUTANEOUS | Status: DC | PRN
  Administered 2023-03-10: 1 via TOPICAL
  Filled 2023-03-10: qty 56

## 2023-03-10 MED ORDER — TETANUS-DIPHTH-ACELL PERTUSSIS 5-2.5-18.5 LF-MCG/0.5 IM SUSY: 0.5000 mL | PREFILLED_SYRINGE | Freq: Once | INTRAMUSCULAR | Status: DC

## 2023-03-10 MED ORDER — ONDANSETRON HCL 4 MG/2ML IJ SOLN: 4.0000 mg | Freq: Four times a day (QID) | INTRAMUSCULAR | Status: DC | PRN

## 2023-03-10 MED ORDER — PRENATAL MULTIVITAMIN CH
1.0000 | ORAL_TABLET | Freq: Every day | ORAL | Status: DC
  Administered 2023-03-10 – 2023-03-11 (×2): 1 via ORAL
  Filled 2023-03-10 (×2): qty 1

## 2023-03-10 MED ORDER — DOCUSATE SODIUM 100 MG PO CAPS
100.0000 mg | ORAL_CAPSULE | Freq: Two times a day (BID) | ORAL | Status: DC
  Administered 2023-03-11: 100 mg via ORAL
  Filled 2023-03-10: qty 1

## 2023-03-10 MED ORDER — OXYTOCIN-SODIUM CHLORIDE 30-0.9 UT/500ML-% IV SOLN: 2.5000 [IU]/h | INTRAVENOUS | Status: DC

## 2023-03-10 MED ORDER — OXYTOCIN 10 UNIT/ML IJ SOLN
INTRAMUSCULAR | Status: AC
  Filled 2023-03-10: qty 2

## 2023-03-10 MED ORDER — MISOPROSTOL 200 MCG PO TABS
ORAL_TABLET | ORAL | Status: AC
  Filled 2023-03-10: qty 4

## 2023-03-10 MED ORDER — DIPHENHYDRAMINE HCL 25 MG PO CAPS: 25.0000 mg | ORAL_CAPSULE | Freq: Four times a day (QID) | ORAL | Status: DC | PRN

## 2023-03-10 MED ORDER — SOD CITRATE-CITRIC ACID 500-334 MG/5ML PO SOLN: 30.0000 mL | ORAL | Status: DC | PRN

## 2023-03-10 MED ORDER — ACETAMINOPHEN 325 MG PO TABS
650.0000 mg | ORAL_TABLET | ORAL | Status: DC | PRN
  Administered 2023-03-10 – 2023-03-11 (×3): 650 mg via ORAL
  Filled 2023-03-10 (×3): qty 2

## 2023-03-10 MED ORDER — AMMONIA AROMATIC IN INHA
RESPIRATORY_TRACT | Status: AC
  Filled 2023-03-10: qty 10

## 2023-03-10 MED ORDER — OXYTOCIN BOLUS FROM INFUSION: 333.0000 mL | Freq: Once | INTRAVENOUS | Status: DC

## 2023-03-10 MED ORDER — LIDOCAINE HCL (PF) 1 % IJ SOLN: 30.0000 mL | INTRAMUSCULAR | Status: DC | PRN

## 2023-03-10 MED ORDER — ZOLPIDEM TARTRATE 5 MG PO TABS: 5.0000 mg | ORAL_TABLET | Freq: Every evening | ORAL | Status: DC | PRN

## 2023-03-10 MED ORDER — SIMETHICONE 80 MG PO CHEW: 80.0000 mg | CHEWABLE_TABLET | ORAL | Status: DC | PRN

## 2023-03-10 MED ORDER — COCONUT OIL OIL
1.0000 | TOPICAL_OIL | Status: DC | PRN
  Administered 2023-03-10: 1 via TOPICAL
  Filled 2023-03-10: qty 15

## 2023-03-10 MED ORDER — LACTATED RINGERS IV SOLN: 500.0000 mL | INTRAVENOUS | Status: DC | PRN

## 2023-03-10 MED ORDER — HYDROXYZINE HCL 25 MG PO TABS: 50.0000 mg | ORAL_TABLET | Freq: Four times a day (QID) | ORAL | Status: DC | PRN

## 2023-03-10 MED ORDER — OXYTOCIN-SODIUM CHLORIDE 30-0.9 UT/500ML-% IV SOLN
INTRAVENOUS | Status: AC
  Filled 2023-03-10: qty 500

## 2023-03-10 MED ORDER — FENTANYL CITRATE (PF) 100 MCG/2ML IJ SOLN: 50.0000 ug | INTRAMUSCULAR | Status: DC | PRN

## 2023-03-10 MED ORDER — IBUPROFEN 600 MG PO TABS
600.0000 mg | ORAL_TABLET | Freq: Four times a day (QID) | ORAL | Status: DC
  Administered 2023-03-10 – 2023-03-11 (×6): 600 mg via ORAL
  Filled 2023-03-10 (×6): qty 1

## 2023-03-10 NOTE — Lactation Note (Signed)
This note was copied from a baby's chart. Lactation Consultation Note  Patient Name: Susan Cannon [id number] Date: 03/10/2023 Age:27 hours Reason for consult: Initial assessment;Term  Lactation to the room for initial visit. Mother is holding the baby and baby has a pacifier. Mother states baby is latching and feeding well. Encouraged feeding on demand and with cues. If baby is not cueing encouraged hand expression and skin to skin. Mother states baby is clicking at the breast with feeds. Encouraged to call [facility] for [place] to evaluate how baby is feeding. Discussed it can be a shallow latch or possible oral restriction.   Encouraged 8 or more attempts in the first 24 hours and 8 or more good feeds after 24 HOL. Reviewed appropriate diapers for days of life and How to know your baby is getting enough to eat. Reviewed "Understanding Postpartum and Newborn Care" booklet at bedside. [facility] # left on board, encouraged to call for any assistance. Mother has no further questions at this time.   Maternal Data Has patient been taught Hand Expression?: Yes Does the patient have breastfeeding experience prior to this delivery?: Yes How long did the patient breastfeed?: 16 mo  Feeding Mother's Current Feeding Choice: Breast Milk  Interventions Interventions: Breast feeding basics reviewed;Education  Discharge Pump: Personal (has a pump at home)  Consult Status Consult Status: Follow-up    Edu On D Esgar Barnick 03/10/2023, 4:49 PM

## 2023-03-10 NOTE — H&P (Addendum)
Susan Cannon is a 27 y.o. female presenting for management of labor.she arrives to the unit c/o regular contractions with increasing intensity. She denies any bloody show  or leaking of fluid.. OB History     Gravida  3   Para  1   Term  1   Preterm      AB  1   Living  1      SAB  1   IAB      Ectopic      Multiple  0   Live Births  1          Past Medical History:  Diagnosis Date   Heart murmur    History of chicken pox    27 y.o.   Past Surgical History:  Procedure Laterality Date   DILATION AND EVACUATION N/A 04/21/2020   Procedure: DILATATION AND EVACUATION (SUCTION D&C);  Surgeon: Christeen Douglas, MD;  Location: [facility] ORS;  Service: Gynecology;  Laterality: N/A;   WISDOM TOOTH EXTRACTION     Family History: family history includes Cancer (age of onset: 45) in her maternal grandmother and paternal grandfather; Colon cancer (age of onset: 2) in her paternal grandfather; Dementia in her paternal grandmother; Diabetes in her mother; Hyperlipidemia in her brother, father, and mother; Hypertension in her father and mother; Stroke in her paternal grandmother. Social History:  reports that she has never smoked. She has never used smokeless tobacco. She reports that she does not drink alcohol and does not use drugs.     Maternal Diabetes: No Genetic Screening: Normal Maternal Ultrasounds/Referrals: Normal Fetal Ultrasounds or other Referrals:  None Maternal Substance Abuse:  No Significant Maternal Medications:  None Significant Maternal Lab Results:  Group B Strep negative Number of Prenatal Visits:greater than 3 verified prenatal visits Other Comments:  None  Review of Systems  Constitutional: Negative.   HENT: Negative.    Eyes: Negative.   Respiratory: Negative.    Cardiovascular: Negative.   Gastrointestinal: Negative.        Gravid abdomen  Endocrine: Negative.   Genitourinary: Negative.   Musculoskeletal: Negative.   Skin: Negative.    Allergic/Immunologic: Negative.   Neurological: Negative.   Hematological: Negative.   Psychiatric/Behavioral: Negative.     History Dilation: 7 Effacement (%): 90 Station: -1 Exam by:: Liana Crocker, CNM Blood pressure 129/85, pulse 92, temperature 98.3 F (36.8 C), temperature source Oral, resp. rate 18, height 5\' 5"  (1.651 m), weight 81.2 kg, last menstrual period 05/30/2022, SpO2 98 %, unknown if currently breastfeeding. Maternal Exam:  Introitus: Normal vulva.   Physical Exam Vitals reviewed.  Constitutional:      Appearance: Normal appearance.  HENT:     Head: Normocephalic and atraumatic.  Cardiovascular:     Rate and Rhythm: Normal rate and regular rhythm.     Pulses: Normal pulses.     Heart sounds: Normal heart sounds.  Pulmonary:     Effort: Pulmonary effort is normal.     Breath sounds: Normal breath sounds.  Abdominal:     Comments: Gravid abdomen Regular moderate strength contractions per palpation.  Genitourinary:    General: Normal vulva.     Rectum: Normal.     Comments: SVE: 7 cms/90%/-2-1. Intact BOW Musculoskeletal:        General: Normal range of motion.     Cervical back: Normal range of motion and neck supple.  Skin:    General: Skin is warm and dry.  Neurological:     General: No focal  deficit present.     Mental Status: She is alert and oriented to person, place, and time.  Psychiatric:        Mood and Affect: Mood normal.        Behavior: Behavior normal.     Comments: Breathing with control through her contractions.     Prenatal labs: ABO, Rh: O/Positive/-- (11/13 1512) Antibody: Negative (11/13 1512) Rubella: 5.51 (11/13 1512) RPR: Non Reactive (03/27 1523)  HBsAg: Negative (11/13 1512)  HIV: Non Reactive (03/27 1523)  GBS: Negative/-- (05/16 1339)   Assessment/Plan: 27 year old G3P1 at 39 weeks 6 days gestation in transitional labor Category 1 FHTs Desires high touch. Low tech labor support GBS negative  Plan: Admit to Labor and  Delivery Saline lock Intermittant auscultation of fhts per protocol Clear liquids Set up Delivery table Routine labs Anticipate NSVD.  [name], CNM  03/10/2023 1:43 AM     [name] Liana Crocker 03/10/2023, 1:40 AM

## 2023-03-10 NOTE — Discharge Summary (Signed)
Postpartum Discharge Summary     Patient Name: Susan Cannon DOB: Jul 12, 1996 MRN: [id number]  Date of admission: 03/09/2023 Delivery date:03/10/2023  Delivering provider: [name]  Date of discharge: 03/11/2023  Admitting diagnosis: Labor and delivery, indication for care [O75.9] Active labor Intrauterine pregnancy: [redacted]w[redacted]d     Secondary diagnosis:  Principal Problem:   Labor and delivery, indication for care Active Problems:   Postpartum care following vaginal delivery  Additional problems: none    Discharge diagnosis: Term Pregnancy Delivered                                              Post partum procedures: none Augmentation: N/A Complications: None  Hospital course: Onset of Labor With Vaginal Delivery      27 y.o. yo [id number] at [redacted]w[redacted]d was admitted in Active Labor on 03/09/2023. Labor course was complicated by nothing  Membrane Rupture Time/Date: 2:10 AM ,03/10/2023   Delivery Method:Vaginal, Spontaneous  Episiotomy:  none Lacerations:   bilateral labial lacerations, unrepaired Patient had an uncomplicated postpartum course. She is ambulating, tolerating a regular diet, passing flatus, and urinating well. Patient is discharged home in stable condition on 03/11/23.  Newborn Data: Birth date:03/10/2023  Birth time:2:45 AM  Gender:Female  Living status:Living  Apgars:9 ,10  Weight:3300 g   Magnesium Sulfate received: No BMZ received: No Rhophylac:N/A MMR:No T-DaP: declined Flu: No Transfusion:No  Physical exam  Vitals:   03/10/23 1138 03/10/23 1556 03/10/23 2314 03/11/23 0754  BP: (!) 129/91 127/81 121/80 116/87  Pulse: 90 98 86 89  Resp: 20 18 18 18   Temp: 98.2 F (36.8 C) 98 F (36.7 C) 98.1 F (36.7 C) 97.6 F (36.4 C)  TempSrc: Oral Oral Oral Oral  SpO2: 100% 99% 100% 100%  Weight:      Height:       General: alert and no distress Lochia: appropriate Uterine Fundus: firm Incision: N/A DVT Evaluation: No evidence of DVT seen on physical  exam. Labs: Lab Results  Component Value Date   WBC 15.8 (H) 03/11/2023   HGB 12.8 03/11/2023   HCT 38.4 03/11/2023   MCV 93.0 03/11/2023   PLT 216 03/11/2023      Latest Ref Rng & Units 08/16/2022    4:02 PM  CMP  Glucose 70 - 99 mg/dL 72   BUN 6 - 20 mg/dL 8   Creatinine 1.30 - 8.65 mg/dL 7.84   Sodium 696 - 295 mmol/L 141   Potassium 3.5 - 5.2 mmol/L 3.9   Chloride 96 - 106 mmol/L 105   CO2 20 - 29 mmol/L 23   Calcium 8.7 - 10.2 mg/dL 9.0   Total Protein 6.0 - 8.5 g/dL 6.2   Total Bilirubin 0.0 - 1.2 mg/dL <2.8   Alkaline Phos 44 - 121 IU/L 67   AST 0 - 40 IU/L 12   ALT 0 - 32 IU/L 8    Edinburgh Score:    03/10/2023    2:19 PM  Edinburgh Postnatal Depression Scale Screening Tool  I have been able to laugh and see the funny side of things. 0  I have looked forward with enjoyment to things. 0  I have blamed myself unnecessarily when things went wrong. 0  I have been anxious or worried for no good reason. 0  I have felt scared or panicky for no good reason. 0  Things have been getting on top of me. 0  I have been so unhappy that I have had difficulty sleeping. 0  I have felt sad or miserable. 0  I have been so unhappy that I have been crying. 0  The thought of harming myself has occurred to me. 0  Edinburgh Postnatal Depression Scale Total 0      After visit meds:  Allergies as of 03/11/2023       Reactions   Sulfa Antibiotics Anaphylaxis        Medication List     STOP taking these medications    aspirin EC 81 MG tablet   multivitamin-prenatal 27-0.8 MG Tabs tablet       TAKE these medications    acetaminophen 325 MG tablet Commonly known as: Tylenol Take 2 tablets (650 mg total) by mouth every 4 (four) hours as needed (for pain scale < 4).   docusate sodium 100 MG capsule Commonly known as: COLACE Take 1 capsule (100 mg total) by mouth 2 (two) times daily.   ibuprofen 600 MG tablet Commonly known as: ADVIL Take 1 tablet (600 mg total)  by mouth every 6 (six) hours.         Discharge home in stable condition Infant Feeding: Breast Infant Disposition:home with mother Discharge instruction: per After Visit Summary and Postpartum booklet. Activity: Advance as tolerated. Pelvic rest for 6 weeks.  Diet: routine diet Anticipated Birth Control: Condoms and Fertility Awareness Postpartum Appointment:6 weeks Additional Postpartum F/U: Postpartum Depression checkup Future Appointments: No future appointments.  Follow up Visit:  Follow-up Information     [name], CNM. Schedule an appointment as soon as possible for a visit.   Specialties: Obstetrics, Gynecology Why: 2 weeks for video visit and 6 weeks in-person Contact information: [address] [place] [address] [phone]                 2 week video visit 6 week in-person    03/11/2023 Susan Cannon, CNM

## 2023-03-10 NOTE — Progress Notes (Signed)
Arm bent for nursing.

## 2023-03-11 ENCOUNTER — Telehealth: Payer: Self-pay

## 2023-03-11 LAB — CBC
HCT: 38.4 % (ref 36.0–46.0)
Hemoglobin: 12.8 g/dL (ref 12.0–15.0)
MCH: 31 pg (ref 26.0–34.0)
MCHC: 33.3 g/dL (ref 30.0–36.0)
MCV: 93 fL (ref 80.0–100.0)
Platelets: 216 10*3/uL (ref 150–400)
RBC: 4.13 MIL/uL (ref 3.87–5.11)
RDW: 13.5 % (ref 11.5–15.5)
WBC: 15.8 10*3/uL — ABNORMAL HIGH (ref 4.0–10.5)
nRBC: 0 % (ref 0.0–0.2)

## 2023-03-11 MED ORDER — ACETAMINOPHEN 325 MG PO TABS: 650.0000 mg | ORAL_TABLET | ORAL | Status: AC | PRN

## 2023-03-11 MED ORDER — IBUPROFEN 600 MG PO TABS: 600.0000 mg | ORAL_TABLET | Freq: Four times a day (QID) | ORAL | 0 refills | Status: AC

## 2023-03-11 MED ORDER — DOCUSATE SODIUM 100 MG PO CAPS: 100.0000 mg | ORAL_CAPSULE | Freq: Two times a day (BID) | ORAL | 0 refills | Status: AC

## 2023-03-11 NOTE — Progress Notes (Signed)
Pt discharged with infant.  Discharge instructions, prescriptions and follow up appointment given to and reviewed with pt. Pt verbalized understanding. Escorted out by auxillary. 

## 2023-03-11 NOTE — Lactation Note (Signed)
This note was copied from a baby's chart. Lactation Consultation Note  Patient Name: Susan Cannon [id number] Date: 03/11/2023 Age:27 hours Reason for consult: Term;Follow-up assessment   Maternal Data This is mom's 2nd baby, SVD. No pertinent negatives documented on mom's medical history.  On follow-up today checked baby's latch. Mom was independently breastfeeding when [facility] entered the room. Baby was latched well, audible swallows noted without any "clicking" sounds.Mom with no breastfeeding questions or concerns.  Has patient been taught Hand Expression?: Yes How long did the patient breastfeed?: 16 months  Feeding Mother's Current Feeding Choice: Breast Milk  LATCH Score Latch: Grasps breast easily, tongue down, lips flanged, rhythmical sucking. (Checked baby's latch as baby had been on ocassion making a clicking sound while breastfeeding. Per care nurse when baby was latched deeper the clicking sound stopped. Baby with good latch and no clicking sound. LC observed last few minutes of feed.)  Audible Swallowing: Spontaneous and intermittent  Type of Nipple: Everted at rest and after stimulation  Comfort (Breast/Nipple): Soft / non-tender  Hold (Positioning): No assistance needed to correctly position infant at breast.  LATCH Score: 10  Interventions Interventions: Breast feeding basics reviewed;Education (latch check)  Discharge Discharge Education: Engorgement and breast care;Warning signs for feeding baby;Outpatient recommendation Pump: Personal  Consult Status Consult Status: Complete Date: 03/11/23 Follow-up type: In-patient  Update provided to care nurse.  Susan Cannon 03/11/2023, 12:19 PM

## 2023-03-11 NOTE — Telephone Encounter (Signed)
Called [name] to let her know I was completing [name] forms but however I need a signature from Susan Cannon to be able to fax forms.

## 2023-03-14 ENCOUNTER — Encounter: Payer: 59 | Admitting: Obstetrics

## 2023-03-14 ENCOUNTER — Ambulatory Visit: Admission: RE | Admit: 2023-03-14 | Payer: 59 | Source: Ambulatory Visit

## 2023-03-24 ENCOUNTER — Encounter: Payer: Self-pay | Admitting: Obstetrics and Gynecology

## 2023-03-24 ENCOUNTER — Telehealth: Payer: 59 | Admitting: Obstetrics and Gynecology

## 2023-03-24 DIAGNOSIS — Z1332 Encounter for screening for maternal depression: Secondary | ICD-10-CM | POA: Diagnosis not present

## 2023-03-24 NOTE — Progress Notes (Signed)
Virtual Visit via Video Note  I connected with Susan Cannon on 03/24/23 at  7:55 AM EDT by video and verified that I was speaking with the correct person using two identifiers.    [name] is a 27 y.o. [id number] who LMP was No LMP recorded. I discussed the limitations, risks, security and privacy concerns of performing an evaluation and management service by video and the availability of in person appointments. I also discussed with the patient that there may be a patient responsible charge related to this service. The patient expressed understanding and agreed to proceed.  Location of patient: Home  Patient gave explicit verbal consent for video visit:  YES  Location of provider:  [facility] office  Persons other than physician and patient involved in provider conference:  None   Subjective:   History of Present Illness:    Patient had a vaginal birth of her second child approximately 2 weeks ago.  She reports that she is doing well and has no issues.  She is breast-feeding full-time.  She is getting some sleep through the night sometimes in a 4-hour stretch.  Reports no problems with urination or bowel movements.  She says being her second child this has been somewhat easier than she remembers.  Hx: The following portions of the patient's history were reviewed and updated as appropriate:             She  has a past medical history of Heart murmur and History of chicken pox. She does not have any pertinent problems on file. She  has a past surgical history that includes Wisdom tooth extraction and Dilation and evacuation (N/A, 04/21/2020). Her family history includes Cancer (age of onset: 64) in her maternal grandmother and paternal grandfather; Colon cancer (age of onset: 75) in her paternal grandfather; Dementia in her paternal grandmother; Diabetes in her mother; Hyperlipidemia in her brother, father, and mother; Hypertension in her father and mother; Stroke in her paternal  grandmother. She  reports that she has never smoked. She has never used smokeless tobacco. She reports that she does not drink alcohol and does not use drugs. She has a current medication list which includes the following prescription(s): acetaminophen, docusate sodium, and ibuprofen. She is allergic to sulfa antibiotics.       Review of Systems:  Review of Systems  Constitutional: Denied constitutional symptoms, night sweats, recent illness, fatigue, fever, insomnia and weight loss.  Eyes: Denied eye symptoms, eye pain, photophobia, vision change and visual disturbance.  Ears/Nose/Throat/Neck: Denied ear, nose, throat or neck symptoms, hearing loss, nasal discharge, sinus congestion and sore throat.  Cardiovascular: Denied cardiovascular symptoms, arrhythmia, chest pain/pressure, edema, exercise intolerance, orthopnea and palpitations.  Respiratory: Denied pulmonary symptoms, asthma, pleuritic pain, productive sputum, cough, dyspnea and wheezing.  Gastrointestinal: Denied, gastro-esophageal reflux, melena, nausea and vomiting.  Genitourinary: Denied genitourinary symptoms including symptomatic vaginal discharge, pelvic relaxation issues, and urinary complaints.  Musculoskeletal: Denied musculoskeletal symptoms, stiffness, swelling, muscle weakness and myalgia.  Dermatologic: Denied dermatology symptoms, rash and scar.  Neurologic: Denied neurology symptoms, dizziness, headache, neck pain and syncope.  Psychiatric: Denied psychiatric symptoms, anxiety and depression.  Endocrine: Denied endocrine symptoms including hot flashes and night sweats.   Meds:   Current Outpatient Medications on File Prior to Visit  Medication Sig Dispense Refill   acetaminophen (TYLENOL) 325 MG tablet Take 2 tablets (650 mg total) by mouth every 4 (four) hours as needed (for pain scale < 4).     docusate sodium (COLACE)  100 MG capsule Take 1 capsule (100 mg total) by mouth 2 (two) times daily. 10 capsule 0    ibuprofen (ADVIL) 600 MG tablet Take 1 tablet (600 mg total) by mouth every 6 (six) hours. 30 tablet 0   No current facility-administered medications on file prior to visit.    Assessment:    [id number] Patient Active Problem List   Diagnosis Date Noted   Labor and delivery, indication for care 03/10/2023   Postpartum care following vaginal delivery 03/10/2023   History of pre-eclampsia in prior pregnancy, currently pregnant in third trimester 12/22/2022   Tetanus, diphtheria, and acellular pertussis (Tdap) vaccination declined 12/22/2022   Pregnancy, supervision, high-risk 08/06/2022   GAD (generalized anxiety disorder) 11/30/2018     1. Postpartum care and examination immediately after delivery   2. Encounter for screening for maternal depression     Patient doing well 2 weeks postpartum.  Without issues.  Plan:            1.  Patient encouraged to keep her 6-week postpartum visit as scheduled.  She will consider birth control methods and decide at that time. Orders No orders of the defined types were placed in this encounter.   No orders of the defined types were placed in this encounter.     F/U  Return in about 4 weeks (around 04/21/2023).   Elonda Husky, M.D. 03/24/2023 7:48 AM

## 2023-04-18 ENCOUNTER — Ambulatory Visit: Payer: 59 | Admitting: Obstetrics

## 2023-04-18 ENCOUNTER — Encounter: Payer: Self-pay | Admitting: Obstetrics

## 2023-04-18 ENCOUNTER — Ambulatory Visit (INDEPENDENT_AMBULATORY_CARE_PROVIDER_SITE_OTHER): Payer: 59 | Admitting: Obstetrics

## 2023-04-18 ENCOUNTER — Other Ambulatory Visit (HOSPITAL_COMMUNITY)
Admission: RE | Admit: 2023-04-18 | Discharge: 2023-04-18 | Disposition: A | Discharge status: Home / Self Care | Payer: 59 | Source: Ambulatory Visit | Attending: Obstetrics | Admitting: Obstetrics

## 2023-04-18 VITALS — BP 121/78 | HR 62 | Wt 157.9 lb

## 2023-04-18 DIAGNOSIS — N898 Other specified noninflammatory disorders of vagina: Secondary | ICD-10-CM | POA: Insufficient documentation

## 2023-04-18 MED ORDER — METRONIDAZOLE 0.75 % VA GEL
1.0000 | Freq: Every day | VAGINAL | 0 refills | Status: AC
Start: 2023-04-18 — End: 2023-04-23

## 2023-04-18 NOTE — Progress Notes (Signed)
Postpartum Visit  Chief Complaint:  Chief Complaint  Patient presents with   Postpartum Care    History of Present Illness: Patient is a 27 y.o. Susan Cannon presents for postpartum visit.  Date of delivery: 03/10/2023 Type of delivery: Vaginal delivery - Vacuum or forceps assisted  no Episiotomy No.  Laceration: yes, bilateral labial lacerations, not repaired Pregnancy or labor problems:  no Any problems since the delivery:  no  Newborn Details:  SINGLETON :  1. Baby's name: girl. Birth weight: 3030gms Maternal Details:  Breast Feeding:  yes Post partum depression/anxiety noted:  no Edinburgh Post-Partum Depression Score:  0  Date of last PAP: 11/202023  normal   Past Medical History:  Diagnosis Date   Heart murmur    History of chicken pox    27 y.o.    Past Surgical History:  Procedure Laterality Date   DILATION AND EVACUATION N/A 04/21/2020   Procedure: DILATATION AND EVACUATION (SUCTION D&C);  Surgeon: Christeen Douglas, MD;  Location: [facility] ORS;  Service: Gynecology;  Laterality: N/A;   WISDOM TOOTH EXTRACTION      Prior to Admission medications   Medication Sig Start Date End Date Taking? Authorizing Provider  docusate sodium (COLACE) 100 MG capsule Take 1 capsule (100 mg total) by mouth 2 (two) times daily. 03/11/23  Yes Free, Sarah J, CNM  metroNIDAZOLE (METROGEL) 0.75 % vaginal gel Place 1 Applicatorful vaginally at bedtime for 5 days. 04/18/23 04/23/23 Yes [name], CNM  acetaminophen (TYLENOL) 325 MG tablet Take 2 tablets (650 mg total) by mouth every 4 (four) hours as needed (for pain scale < 4). Patient not taking: Reported on 04/18/2023 03/11/23   Free, Lindalou Hose, CNM  ibuprofen (ADVIL) 600 MG tablet Take 1 tablet (600 mg total) by mouth every 6 (six) hours. Patient not taking: Reported on 04/18/2023 03/11/23   Free, Lindalou Hose, CNM    Allergies  Allergen Reactions   Sulfa Antibiotics Anaphylaxis     Social History   Socioeconomic History   Marital  status: Married    Spouse name: Reuel Boom   Number of children: 1   Years of education: 14   Highest education level: Not on file  Occupational History   Occupation: Scientist, research (medical): [employer]    Comment: ED  Tobacco Use   Smoking status: Never   Smokeless tobacco: Never  Vaping Use   Vaping status: Never Used  Substance and Sexual Activity   Alcohol use: No   Drug use: No   Sexual activity: Yes    Partners: Male    Birth control/protection: None  Other Topics Concern   Not on file  Social History Narrative   Not on file   Social Determinants of Health   Financial Resource Strain: Low Risk  (08/06/2022)   Overall Financial Resource Strain (CARDIA)    Difficulty of Paying Living Expenses: Not very hard  Food Insecurity: No Food Insecurity (03/10/2023)   Hunger Vital Sign    Worried About Running Out of Food in the Last Year: Never true    Ran Out of Food in the Last Year: Never true  Transportation Needs: No Transportation Needs (03/10/2023)   PRAPARE - Administrator, Civil Service (Medical): No    Lack of Transportation (Non-Medical): No  Physical Activity: Insufficiently Active (08/06/2022)   Exercise Vital Sign    Days of Exercise per Week: 3 days    Minutes of Exercise per Session: 30 min  Stress: No Stress  Concern Present (08/06/2022)   [employer] of Occupational Health - Occupational Stress Questionnaire    Feeling of Stress : Not at all  Social Connections: Moderately Integrated (08/06/2022)   Social Connection and Isolation Panel [NHANES]    Frequency of Communication with Friends and Family: Three times a week    Frequency of Social Gatherings with Friends and Family: Three times a week    Attends Religious Services: More than 4 times per year    Active Member of Clubs or Organizations: No    Attends Banker Meetings: Never    Marital Status: Married  Catering manager Violence: Not At Risk (08/06/2022)   Humiliation, Afraid,  Rape, and Kick questionnaire    Fear of Current or Ex-Partner: No    Emotionally Abused: No    Physically Abused: No    Sexually Abused: No    Family History  Problem Relation Age of Onset   Diabetes Mother    Hyperlipidemia Mother    Hypertension Mother    Hyperlipidemia Father    Hypertension Father    Hyperlipidemia Brother    Cancer Maternal Grandmother 81       brain   Stroke Paternal Grandmother    Dementia Paternal Grandmother    Colon cancer Paternal Grandfather 46   Cancer Paternal Grandfather 66       liver    ROS   Physical Exam BP 121/78   Pulse 62   Wt 157 lb 14.4 oz (71.6 kg)   Breastfeeding Yes   BMI 26.28 kg/m   OBGyn Exam   Female Chaperone present during breast and/or pelvic exam.  Assessment: 27 y.o. [id number] presenting for 6 week postpartum visit  Plan: Problem List Items Addressed This Visit   None Visit Diagnoses     Vaginal discharge    -  Primary   Relevant Medications   metroNIDAZOLE (METROGEL) 0.75 % vaginal gel   Other Relevant Orders   Cervicovaginal ancillary only   Postpartum care and examination immediately after delivery       Relevant Orders   Cervicovaginal ancillary only        1) Contraception Education given regarding options for contraception, including  NFP or condoms .she does not want to use hormonal methods at this time.  2)  Pap - ASCCP guidelines and rational discussed.  Patient opts for annual screening interval  3) Patient underwent screening for postpartum depression with no concerns noted.  4) Follow up 1 year for routine annual exam  [name], CNM  04/18/2023 1:56 PM   04/18/2023 1:54 PM

## 2023-04-20 LAB — CERVICOVAGINAL ANCILLARY ONLY
Bacterial Vaginitis (gardnerella): NEGATIVE
Candida Glabrata: NEGATIVE
Candida Vaginitis: NEGATIVE
Comment: NEGATIVE
Comment: NEGATIVE
Comment: NEGATIVE

## 2023-06-15 ENCOUNTER — Ambulatory Visit (INDEPENDENT_AMBULATORY_CARE_PROVIDER_SITE_OTHER): Payer: 59

## 2023-06-15 VITALS — BP 113/73 | HR 87 | Ht 65.0 in | Wt 155.6 lb

## 2023-06-15 DIAGNOSIS — N939 Abnormal uterine and vaginal bleeding, unspecified: Secondary | ICD-10-CM

## 2023-06-15 NOTE — Progress Notes (Signed)
   GYN ENCOUNTER  Encounter for vaginal bleeding  Subjective  HPI: Susan Cannon is a 27 y.o. [id number] who presents today for vaginal bleeding. First noticed after having intercourse about 1 month ago, bled for about 5-6 days, more spotting than normal period bleeding.  Had some bleeding again last week, had to wear a pad, darker blood this time and some moderate pelvic pain. Bleeding stopped about two days ago.  She had a normal SVB on June 13 of this year and has been exclusively breastfeeding since that time. This baby has been sleeping through the night at an earlier age than her previous pregnancy. She did not resume her period for 7 months after her first pregnancy.  Past Medical History:  Diagnosis Date   Heart murmur    History of chicken pox    27 y.o.   History of pre-eclampsia in prior pregnancy, currently pregnant in third trimester 12/22/2022   Past Surgical History:  Procedure Laterality Date   DILATION AND EVACUATION N/A 04/21/2020   Procedure: DILATATION AND EVACUATION (SUCTION D&C);  Surgeon: Christeen Douglas, MD;  Location: [facility] ORS;  Service: Gynecology;  Laterality: N/A;   WISDOM TOOTH EXTRACTION     OB History     Gravida  3   Para  2   Term  2   Preterm      AB  1   Living  2      SAB  1   IAB      Ectopic      Multiple  0   Live Births  2          Allergies  Allergen Reactions   Sulfa Antibiotics Anaphylaxis    Review of Systems  12 point ROS negative except for pertinent positives noted in HPO above.   Objective  BP 113/73   Pulse 87   Ht 5\' 5"  (1.651 m)   Wt 155 lb 9.6 oz (70.6 kg)   Breastfeeding Yes   BMI 25.89 kg/m   Physical examination GENERAL APPEARANCE: alert, well appearing LUNGS: normal work of breathing HEART: normal heart rate  Assessment/Plan - Reviewed that her symptoms are consistent with return of menstruation in the postpartum period. Discussed how the return to menstruation can be different with  every pregnancy and also impacted by infants sleep and feeding schedule.  - Reviewed return precautions.  Susan Cannon, CNM  06/15/23 10:30 AM

## 2023-09-02 ENCOUNTER — Ambulatory Visit
Admission: EM | Admit: 2023-09-02 | Discharge: 2023-09-02 | Disposition: A | Discharge status: Home / Self Care | Payer: 59 | Attending: Internal Medicine | Admitting: Internal Medicine

## 2023-09-02 DIAGNOSIS — J029 Acute pharyngitis, unspecified: Secondary | ICD-10-CM | POA: Insufficient documentation

## 2023-09-02 LAB — POCT RAPID STREP A (OFFICE): Rapid Strep A Screen: NEGATIVE

## 2023-09-02 NOTE — ED Triage Notes (Signed)
Sore throat, pt states she felt warm Tuesday night and could of had a possible fever but did not check it at home, body aches, headache x 4 days. Not taking any OTC medication at this time.   Pt states she is currently breastfeeding.

## 2023-09-02 NOTE — Discharge Instructions (Addendum)
Warm salt water gargle as needed Please maintain adequate hydration Humidifier and VapoRub use will help with nasal congestion You may take Tylenol or ibuprofen as needed for pain Strep test is negative Will send your throat sample for cultures and we will call you with recommendations if labs are abnormal Please feel free to return to urgent care if symptoms worsen.

## 2023-09-02 NOTE — ED Provider Notes (Signed)
Susan Cannon    CSN: [id number] Arrival date & time: 09/02/23  0803      History   Chief Complaint Chief Complaint  Patient presents with   Sore Throat    HPI Susan Cannon is a 27 y.o. female comes to the urgent care with 4-day history of nasal congestion and sore throat.  Patient's symptoms started 4 days ago and has been persistent.  Her throat pain is moderately severe and aggravated by swallowing.  No fever or chills.  Pain seems to radiate to the right ear.  No shortness of breath or wheezing.  No significant cough, shortness of breath or wheezing.  Patient denies any generalized bodyaches.  No nausea vomiting or diarrhea.Susan Cannon   HPI  Past Medical History:  Diagnosis Date   Heart murmur    History of chicken pox    27 y.o.   History of pre-eclampsia in prior pregnancy, currently pregnant in third trimester 12/22/2022    Patient Active Problem List   Diagnosis Date Noted   Labor and delivery, indication for care 03/10/2023   Tetanus, diphtheria, and acellular pertussis (Tdap) vaccination declined 12/22/2022   GAD (generalized anxiety disorder) 11/30/2018    Past Surgical History:  Procedure Laterality Date   DILATION AND EVACUATION N/A 04/21/2020   Procedure: DILATATION AND EVACUATION (SUCTION D&C);  Surgeon: Christeen Douglas, MD;  Location: [facility] OR[facility];  Service: Gynecology;  Laterality: N/A;   WISDOM TOOTH EXTRACTION      OB History     Gravida  3   Para  2   Term  2   Preterm      AB  1   Living  2      SAB  1   IAB      Ectopic      Multiple  0   Live Births  2            Home Medications    Prior to Admission medications   Medication Sig Start Date End Date Taking? Authorizing Provider  Prenatal Vit-Fe Fumarate-FA (MULTIVITAMIN-PRENATAL) 27-0.8 MG TABS tablet Take 1 tablet by mouth daily at 12 noon.   Yes [provider]  acetaminophen (TYLENOL) 325 MG tablet Take 2 tablets (650 mg total) by mouth every 4 (four) hours  as needed (for pain scale < 4). Patient not taking: Reported on 04/18/2023 03/11/23   Free, Lindalou Hose, CNM  docusate sodium (COLACE) 100 MG capsule Take 1 capsule (100 mg total) by mouth 2 (two) times daily. Patient not taking: Reported on 06/15/2023 03/11/23   Free, Lindalou Hose, CNM  ibuprofen (ADVIL) 600 MG tablet Take 1 tablet (600 mg total) by mouth every 6 (six) hours. Patient not taking: Reported on 04/18/2023 03/11/23   Free, Lindalou Hose, CNM    Family History Family History  Problem Relation Age of Onset   Diabetes Mother    Hyperlipidemia Mother    Hypertension Mother    Hyperlipidemia Father    Hypertension Father    Hyperlipidemia Brother    Cancer Maternal Grandmother 21       brain   Stroke Paternal Grandmother    Dementia Paternal Grandmother    Colon cancer Paternal Grandfather 58   Cancer Paternal Grandfather 25       liver    Social History Social History   Tobacco Use   Smoking status: Never   Smokeless tobacco: Never  Vaping Use   Vaping status: Never Used  Substance Use Topics  Alcohol use: No   Drug use: No     Allergies   Sulfa antibiotics   Review of Systems Review of Systems As per HPI  Physical Exam Triage Vital Signs ED Triage Vitals  Encounter Vitals Group     BP 09/02/23 0925 116/78     Systolic BP Percentile --      Diastolic BP Percentile --      Pulse Rate 09/02/23 0925 72     Resp 09/02/23 0925 16     Temp 09/02/23 0925 98.2 F (36.8 C)     Temp Source 09/02/23 0925 Oral     SpO2 09/02/23 0925 98 %     Weight --      Height --      Head Circumference --      Peak Flow --      Pain Score 09/02/23 0926 5     Pain Loc --      Pain Education --      Exclude from Growth Chart --    No data found.  Updated Vital Signs BP 116/78 (BP Location: Left Arm)   Pulse 72   Temp 98.2 F (36.8 C) (Oral)   Resp 16   LMP 08/25/2023 (Exact Date)   SpO2 98%   Breastfeeding Yes   Visual Acuity Right Eye Distance:   Left Eye Distance:    Bilateral Distance:    Right Eye Near:   Left Eye Near:    Bilateral Near:     Physical Exam Vitals and nursing note reviewed.  Constitutional:      General: She is not in acute distress.    Appearance: She is not ill-appearing.  HENT:     Right Ear: Tympanic membrane normal.     Left Ear: Tympanic membrane normal.     Mouth/Throat:     Mouth: Mucous membranes are moist.     Pharynx: Posterior oropharyngeal erythema present.  Cardiovascular:     Rate and Rhythm: Normal rate and regular rhythm.     Heart sounds: Normal heart sounds.  Pulmonary:     Effort: Pulmonary effort is normal.     Breath sounds: Normal breath sounds.  Abdominal:     General: Bowel sounds are normal.     Palpations: Abdomen is soft.  Neurological:     Mental Status: She is alert.      UC Treatments / Results  Labs (all labs ordered are listed, but only abnormal results are displayed) Labs Reviewed  CULTURE, GROUP A STREP [facility])  POCT RAPID STREP A (OFFICE)    EKG   Radiology No results found.  Procedures Procedures (including critical care time)  Medications Ordered in UC Medications - No data to display  Initial Impression / Assessment and Plan / UC Course  I have reviewed the triage vital signs and the nursing notes.  Pertinent labs & imaging results that were available during my care of the patient were reviewed by me and considered in my medical decision making (see chart for details).     1.  Acute viral pharyngitis: Point-of-care strep is negative Throat cultures have been sent Warm salt water gargle Tylenol or ibuprofen as needed for pain Patient advised to maintain adequate hydration Return precautions given. Final Clinical Impressions(s) / UC Diagnoses   Final diagnoses:  Acute viral pharyngitis     Discharge Instructions      Warm salt water gargle as needed Please maintain adequate hydration Humidifier and VapoRub use will help  with nasal congestion You  may take Tylenol or ibuprofen as needed for pain Strep test is negative Will send your throat sample for cultures and we will call you with recommendations if labs are abnormal Please feel free to return to urgent care if symptoms worsen.   ED Prescriptions   None    PDMP not reviewed this encounter.   Merrilee Jansky, MD 09/02/23 [phone]

## 2023-09-05 LAB — CULTURE, GROUP A STREP (THRC)

## 2024-09-04 ENCOUNTER — Ambulatory Visit
Admission: EM | Admit: 2024-09-04 | Discharge: 2024-09-04 | Disposition: A | Discharge status: Home / Self Care | Attending: Emergency Medicine | Admitting: Emergency Medicine

## 2024-09-04 DIAGNOSIS — J0100 Acute maxillary sinusitis, unspecified: Secondary | ICD-10-CM

## 2024-09-04 DIAGNOSIS — H1032 Unspecified acute conjunctivitis, left eye: Secondary | ICD-10-CM | POA: Diagnosis not present

## 2024-09-04 MED ORDER — AMOXICILLIN 875 MG PO TABS: 875.0000 mg | ORAL_TABLET | Freq: Two times a day (BID) | ORAL | 0 refills | Status: AC

## 2024-09-04 MED ORDER — IBUPROFEN 800 MG PO TABS: 800.0000 mg | ORAL_TABLET | Freq: Once | ORAL | Status: DC

## 2024-09-04 MED ORDER — POLYMYXIN B-TRIMETHOPRIM 10000-0.1 UNIT/ML-% OP SOLN: 1.0000 [drp] | Freq: Four times a day (QID) | OPHTHALMIC | 0 refills | Status: AC

## 2024-09-04 NOTE — Discharge Instructions (Addendum)
 Take the amoxicillin  as directed for sinus infection.  Use the eyedrops as directed for pinkeye.  Follow-up with your primary care provider if you are not improving.

## 2024-09-04 NOTE — ED Triage Notes (Signed)
 Patient to Urgent Care with complaints of headaches/ body aches/ nasal congestion/ ear pain and fullness/ left sided eye drainage/ cough. Denies any known fevers.  Symptoms started Thursday.   Meds: dayquil.  Currently breastfeeding.

## 2024-09-04 NOTE — ED Provider Notes (Signed)
 Susan Cannon    CSN: [id number] Arrival date & time: 09/04/24  9087      History   Chief Complaint Chief Complaint  Patient presents with   Cough   Nasal Congestion    HPI Susan Cannon is a 28 y.o. female.  Patient presents with 5-day history of congestion, cough, body aches, headache.  No fever or shortness of breath.  She has been treating her symptoms with DayQuil.  Patient also presents with 2-day history of left eye redness, itching, and drainage.  No eye trauma, eye pain, change in vision.  No treatments at home.  Patient is breast-feeding.  The history is provided by the patient and medical records.    Past Medical History:  Diagnosis Date   Heart murmur    History of chicken pox    28 y.o.   History of pre-eclampsia in prior pregnancy, currently pregnant in third trimester 12/22/2022    Patient Active Problem List   Diagnosis Date Noted   Labor and delivery, indication for care 03/10/2023   Tetanus, diphtheria, and acellular pertussis (Tdap) vaccination declined 12/22/2022   GAD (generalized anxiety disorder) 11/30/2018    Past Surgical History:  Procedure Laterality Date   DILATION AND EVACUATION N/A 04/21/2020   Procedure: DILATATION AND EVACUATION (SUCTION D&C);  Surgeon: Verdon Keen, MD;  Location: [facility] ORS;  Service: Gynecology;  Laterality: N/A;   WISDOM TOOTH EXTRACTION      OB History     Gravida  3   Para  2   Term  2   Preterm      AB  1   Living  2      SAB  1   IAB      Ectopic      Multiple  0   Live Births  2            Home Medications    Prior to Admission medications   Medication Sig Start Date End Date Taking? Authorizing Provider  amoxicillin  (AMOXIL ) 875 MG tablet Take 1 tablet (875 mg total) by mouth 2 (two) times daily for 10 days. 09/04/24 09/14/24 Yes Corlis Burnard DEL, NP  trimethoprim -polymyxin b  (POLYTRIM ) ophthalmic solution Place 1 drop into both eyes 4 (four) times daily for 7 days. 09/04/24  09/11/24 Yes Corlis Burnard DEL, NP  acetaminophen  (TYLENOL ) 325 MG tablet Take 2 tablets (650 mg total) by mouth every 4 (four) hours as needed (for pain scale < 4). Patient not taking: Reported on 04/18/2023 03/11/23   Free, Lauraine PARAS, CNM  docusate sodium  (COLACE) 100 MG capsule Take 1 capsule (100 mg total) by mouth 2 (two) times daily. Patient not taking: Reported on 06/15/2023 03/11/23   Free, Lauraine PARAS, CNM  ibuprofen  (ADVIL ) 600 MG tablet Take 1 tablet (600 mg total) by mouth every 6 (six) hours. Patient not taking: Reported on 04/18/2023 03/11/23   Free, Lauraine PARAS, CNM  Prenatal Vit-Fe Fumarate-FA (MULTIVITAMIN-PRENATAL) 27-0.8 MG TABS tablet Take 1 tablet by mouth daily at 12 noon.    [provider]    Family History Family History  Problem Relation Age of Onset   Diabetes Mother    Hyperlipidemia Mother    Hypertension Mother    Hyperlipidemia Father    Hypertension Father    Hyperlipidemia Brother    Cancer Maternal Grandmother 68       brain   Stroke Paternal Grandmother    Dementia Paternal Grandmother    Colon cancer Paternal Grandfather 31  Cancer Paternal Grandfather 59       liver    Social History Social History   Tobacco Use   Smoking status: Never   Smokeless tobacco: Never  Vaping Use   Vaping status: Never Used  Substance Use Topics   Alcohol use: No   Drug use: No     Allergies   Sulfa antibiotics   Review of Systems Review of Systems  Constitutional:  Negative for chills and fever.  HENT:  Positive for congestion, ear pain, postnasal drip, rhinorrhea and sinus pressure. Negative for sore throat.   Eyes:  Positive for discharge, redness and itching. Negative for pain and visual disturbance.  Respiratory:  Positive for cough. Negative for shortness of breath.   Cardiovascular:  Negative for chest pain and palpitations.  Gastrointestinal:  Negative for diarrhea and vomiting.  Neurological:  Positive for headaches.     Physical Exam Triage  Vital Signs ED Triage Vitals  Encounter Vitals Group     BP 09/04/24 0933 115/76     Girls Systolic BP Percentile --      Girls Diastolic BP Percentile --      Boys Systolic BP Percentile --      Boys Diastolic BP Percentile --      Pulse Rate 09/04/24 0933 90     Resp 09/04/24 0933 16     Temp 09/04/24 0933 (!) 97.1 F (36.2 C)     Temp src --      SpO2 09/04/24 0933 98 %     Weight --      Height --      Head Circumference --      Peak Flow --      Pain Score 09/04/24 0932 0     Pain Loc --      Pain Education --      Exclude from Growth Chart --    No data found.  Updated Vital Signs BP 115/76   Pulse 90   Temp (!) 97.1 F (36.2 C)   Resp 16   LMP 08/22/2024   SpO2 98%   Breastfeeding Yes   Visual Acuity Right Eye Distance:   Left Eye Distance:   Bilateral Distance:    Right Eye Near:   Left Eye Near:    Bilateral Near:     Physical Exam Constitutional:      General: She is not in acute distress. HENT:     Right Ear: Tympanic membrane normal.     Left Ear: Tympanic membrane normal.     Nose: Congestion and rhinorrhea present.     Mouth/Throat:     Mouth: Mucous membranes are moist.     Pharynx: Oropharynx is clear.  Eyes:     General: Lids are normal. Vision grossly intact.        Left eye: Discharge present.    Extraocular Movements: Extraocular movements intact.     Conjunctiva/sclera:     Left eye: Left conjunctiva is injected.     Pupils: Pupils are equal, round, and reactive to light.  Cardiovascular:     Rate and Rhythm: Normal rate and regular rhythm.     Heart sounds: Normal heart sounds.  Pulmonary:     Effort: Pulmonary effort is normal. No respiratory distress.     Breath sounds: Normal breath sounds.  Neurological:     Mental Status: She is alert.      UC Treatments / Results  Labs (all labs ordered are listed, but only  abnormal results are displayed) Labs Reviewed - No data to display  EKG   Radiology No results  found.  Procedures Procedures (including critical care time)  Medications Ordered in UC Medications - No data to display  Initial Impression / Assessment and Plan / UC Course  I have reviewed the triage vital signs and the nursing notes.  Pertinent labs & imaging results that were available during my care of the patient were reviewed by me and considered in my medical decision making (see chart for details).    Acute sinusitis, left bacterial conjunctivitis, lactating mother.  Afebrile and vital signs are stable.  Lungs are clear and O2 sat is 98% on room air.  Patient has been symptomatic for 5 days.  Treating sinus infection with amoxicillin .  Treating conjunctivitis with Polytrim  eyedrops.  Education provided on sinus infection and conjunctivitis.  Instructed patient to follow-up with her PCP if she is not improving.  She agrees to plan of care.  Final Clinical Impressions(s) / UC Diagnoses   Final diagnoses:  Acute non-recurrent maxillary sinusitis  Acute bacterial conjunctivitis of left eye  Lactating mother     Discharge Instructions      Take the amoxicillin  as directed for sinus infection.  Use the eyedrops as directed for pinkeye.  Follow-up with your primary care provider if you are not improving.     ED Prescriptions     Medication Sig Dispense Auth. Provider   amoxicillin  (AMOXIL ) 875 MG tablet Take 1 tablet (875 mg total) by mouth 2 (two) times daily for 10 days. 20 tablet Corlis Burnard DEL, NP   trimethoprim -polymyxin b  (POLYTRIM ) ophthalmic solution Place 1 drop into both eyes 4 (four) times daily for 7 days. 10 mL Corlis Burnard DEL, NP      PDMP not reviewed this encounter.   Corlis Burnard DEL, NP 09/04/24 1012
